# Patient Record
Sex: Female | Born: 1995 | Race: Black or African American | Hispanic: No | Marital: Single | Smoking: Never smoker
Health system: Southern US, Community
[De-identification: ages and names within clinical notes are randomized; demographics above are authoritative.]

## PROBLEM LIST (undated history)

## (undated) DIAGNOSIS — Z789 Other specified health status: Secondary | ICD-10-CM

## (undated) DIAGNOSIS — O24419 Gestational diabetes mellitus in pregnancy, unspecified control: Secondary | ICD-10-CM

## (undated) HISTORY — DX: Other specified health status: Z78.9

## (undated) HISTORY — PX: NO PAST SURGERIES: SHX2092

---

## 2015-11-08 NOTE — L&D Delivery Note (Signed)
20 y/o G1 now P1 who presents for SOL at 38 wks and 4 days. PT has had an uncomplicated pregnancy.  Delivery Note At 9:23 AM a viable female was delivered via Vaginal, Spontaneous Delivery (Presentation: vertex ; OA  ).  APGAR: 8, 9; weight 5 lb 15.8 oz (2716 g).   Placenta status: delivered with gentle traction.  Cord: 3 vessel with the following complications: none .   Anesthesia:  none Episiotomy: None Lacerations: Periurethral - no repain Est. Blood Loss (mL): 100  Mom to postpartum.  Baby to Couplet care / Skin to Skin.  [name] 08/18/2016, 10:44 AM

## 2016-03-17 ENCOUNTER — Encounter: Payer: Self-pay | Admitting: *Deleted

## 2016-04-12 ENCOUNTER — Ambulatory Visit (INDEPENDENT_AMBULATORY_CARE_PROVIDER_SITE_OTHER): Payer: Medicaid Other | Admitting: Student

## 2016-04-12 ENCOUNTER — Telehealth: Payer: Self-pay

## 2016-04-12 ENCOUNTER — Encounter: Payer: Self-pay | Admitting: Student

## 2016-04-12 ENCOUNTER — Other Ambulatory Visit (HOSPITAL_COMMUNITY)
Admission: RE | Admit: 2016-04-12 | Discharge: 2016-04-12 | Disposition: A | Discharge status: Home / Self Care | Payer: Medicaid Other | Source: Ambulatory Visit | Attending: Family Medicine | Admitting: Family Medicine

## 2016-04-12 VITALS — BP 124/70 | HR 84 | Wt 166.0 lb

## 2016-04-12 DIAGNOSIS — O0932 Supervision of pregnancy with insufficient antenatal care, second trimester: Secondary | ICD-10-CM

## 2016-04-12 DIAGNOSIS — Z1389 Encounter for screening for other disorder: Secondary | ICD-10-CM

## 2016-04-12 DIAGNOSIS — Z113 Encounter for screening for infections with a predominantly sexual mode of transmission: Secondary | ICD-10-CM

## 2016-04-12 LAB — POCT URINALYSIS DIP (DEVICE)
BILIRUBIN URINE: NEGATIVE
GLUCOSE, UA: NEGATIVE mg/dL
Hgb urine dipstick: NEGATIVE
KETONES UR: NEGATIVE mg/dL
Nitrite: NEGATIVE
Protein, ur: NEGATIVE mg/dL
Specific Gravity, Urine: 1.015 (ref 1.005–1.030)
Urobilinogen, UA: 0.2 mg/dL (ref 0.0–1.0)
pH: 7 (ref 5.0–8.0)

## 2016-04-12 MED ORDER — PRENATAL VITAMINS PLUS 27-1 MG PO TABS: 1.0000 | ORAL_TABLET | Freq: Every day | ORAL | Status: DC

## 2016-04-12 NOTE — Progress Notes (Signed)
  Subjective:    Melissa ErichsenDestiny Thornton is being seen today for her first obstetrical visit.  This is not a planned pregnancy. She is at 5854w2d gestation. Her obstetrical history is significant for late to care. Relationship with FOB: significant other, living together. Patient does intend to breast feed. Pregnancy history fully reviewed.  Patient reports no complaints.  Review of Systems:   Review of Systems Review of Systems - History obtained from the patient General ROS: negative Respiratory ROS: no cough, shortness of breath, or wheezing Cardiovascular ROS: no chest pain or dyspnea on exertion Gastrointestinal ROS: no abdominal pain, change in bowel habits, or black or bloody stools Genito-Urinary ROS: no dysuria, trouble voiding, or hematuria Musculoskeletal ROS: negative  Objective:  FHT 153 by doppler   BP 124/70 mmHg  Pulse 84  Wt 166 lb (75.297 kg)  LMP 11/22/2015 (Exact Date) Physical Exam  Exam Physical Examination: General appearance - alert, well appearing, and in no distress and oriented to person, place, and time Mental status - alert, oriented to person, place, and time, normal mood, behavior, speech, dress, motor activity, and thought processes Mouth - mucous membranes moist, pharynx normal without lesions and dental hygiene good Heart - normal rate, regular rhythm, normal S1, S2, no murmurs, rubs, clicks or gallops Abdomen - soft, nontender, nondistended, no masses or organomegaly. Fundal height 19 cm Pelvic - exam declined by the patient, examination not indicated Musculoskeletal - no joint tenderness, deformity or swelling Skin - normal coloration and turgor, no rashes, no suspicious skin lesions noted    Assessment:    Pregnancy: G1P0 Patient Active Problem List   Diagnosis Date Noted  . Late prenatal care affecting pregnancy in second trimester 04/12/2016       Plan:  1. Late prenatal care affecting pregnancy in second trimester  - Prenatal Profile -  [facility]Chlam[facility] - Culture, OB Urine - US MFM OB COMP + 14 WK; Future - [id number] PDM PROFILE  2. Encounter for routine screening for malformation using ultrasonics  - US MFM OB COMP + 14 WK; Future    Initial labs drawn. Prenatal vitamins. Problem list reviewed and updated. AFP3 discussed: declined. Role of ultrasound in pregnancy discussed; fetal survey: ordered. Amniocentesis discussed: not indicated. Follow up in 4 weeks.    Judeth Hornrin Archita Lomeli 04/12/2016

## 2016-04-12 NOTE — Telephone Encounter (Signed)
Pt's pharmacy called and stated that pt needed an Rx for her prenatal vitamins. Called pt's pharmacy and verified that they received the Rx.  Was informed that the pharmacy would contact the pt to pick up Rx.

## 2016-04-12 NOTE — Patient Instructions (Signed)
Second Trimester of Pregnancy The second trimester is from week 13 through week 28, months 4 through 6. The second trimester is often a time when you feel your best. Your body has also adjusted to being pregnant, and you begin to feel better physically. Usually, morning sickness has lessened or quit completely, you may have more energy, and you may have an increase in appetite. The second trimester is also a time when the fetus is growing rapidly. At the end of the sixth month, the fetus is about 9 inches long and weighs about 1 pounds. You will likely begin to feel the baby move (quickening) between 18 and 20 weeks of the pregnancy. BODY CHANGES Your body goes through many changes during pregnancy. The changes vary from woman to woman.   Your weight will continue to increase. You will notice your lower abdomen bulging out.  You may begin to get stretch marks on your hips, abdomen, and breasts.  You may develop headaches that can be relieved by medicines approved by your health care provider.  You may urinate more often because the fetus is pressing on your bladder.  You may develop or continue to have heartburn as a result of your pregnancy.  You may develop constipation because certain hormones are causing the muscles that push waste through your intestines to slow down.  You may develop hemorrhoids or swollen, bulging veins (varicose veins).  You may have back pain because of the weight gain and pregnancy hormones relaxing your joints between the bones in your pelvis and as a result of a shift in weight and the muscles that support your balance.  Your breasts will continue to grow and be tender.  Your gums may bleed and may be sensitive to brushing and flossing.  Dark spots or blotches (chloasma, mask of pregnancy) may develop on your face. This will likely fade after the baby is born.  A dark line from your belly button to the pubic area (linea nigra) may appear. This will likely fade  after the baby is born.  You may have changes in your hair. These can include thickening of your hair, rapid growth, and changes in texture. Some women also have hair loss during or after pregnancy, or hair that feels dry or thin. Your hair will most likely return to normal after your baby is born. WHAT TO EXPECT AT YOUR PRENATAL VISITS During a routine prenatal visit:  You will be weighed to make sure you and the fetus are growing normally.  Your blood pressure will be taken.  Your abdomen will be measured to track your baby's growth.  The fetal heartbeat will be listened to.  Any test results from the previous visit will be discussed. Your health care provider may ask you:  How you are feeling.  If you are feeling the baby move.  If you have had any abnormal symptoms, such as leaking fluid, bleeding, severe headaches, or abdominal cramping.  If you are using any tobacco products, including cigarettes, chewing tobacco, and electronic cigarettes.  If you have any questions. Other tests that may be performed during your second trimester include:  Blood tests that check for:  Low iron levels (anemia).  Gestational diabetes (between 24 and 28 weeks).  Rh antibodies.  Urine tests to check for infections, diabetes, or protein in the urine.  An ultrasound to confirm the proper growth and development of the baby.  An amniocentesis to check for possible genetic problems.  Fetal screens for spina bifida   and Down syndrome.  HIV (human immunodeficiency virus) testing. Routine prenatal testing includes screening for HIV, unless you choose not to have this test. HOME CARE INSTRUCTIONS   Avoid all smoking, herbs, alcohol, and unprescribed drugs. These chemicals affect the formation and growth of the baby.  Do not use any tobacco products, including cigarettes, chewing tobacco, and electronic cigarettes. If you need help quitting, ask your health care provider. You may receive  counseling support and other resources to help you quit.  Follow your health care provider's instructions regarding medicine use. There are medicines that are either safe or unsafe to take during pregnancy.  Exercise only as directed by your health care provider. Experiencing uterine cramps is a good sign to stop exercising.  Continue to eat regular, healthy meals.  Wear a good support bra for breast tenderness.  Do not use hot tubs, steam rooms, or saunas.  Wear your seat belt at all times when driving.  Avoid raw meat, uncooked cheese, cat litter boxes, and soil used by cats. These carry germs that can cause birth defects in the baby.  Take your prenatal vitamins.  Take 1500-2000 mg of calcium daily starting at the 20th week of pregnancy until you deliver your baby.  Try taking a stool softener (if your health care provider approves) if you develop constipation. Eat more high-fiber foods, such as fresh vegetables or fruit and whole grains. Drink plenty of fluids to keep your urine clear or pale yellow.  Take warm sitz baths to soothe any pain or discomfort caused by hemorrhoids. Use hemorrhoid cream if your health care provider approves.  If you develop varicose veins, wear support hose. Elevate your feet for 15 minutes, 3-4 times a day. Limit salt in your diet.  Avoid heavy lifting, wear low heel shoes, and practice good posture.  Rest with your legs elevated if you have leg cramps or low back pain.  Visit your dentist if you have not gone yet during your pregnancy. Use a soft toothbrush to brush your teeth and be gentle when you floss.  A sexual relationship may be continued unless your health care provider directs you otherwise.  Continue to go to all your prenatal visits as directed by your health care provider. SEEK MEDICAL CARE IF:   You have dizziness.  You have mild pelvic cramps, pelvic pressure, or nagging pain in the abdominal area.  You have persistent nausea,  vomiting, or diarrhea.  You have a bad smelling vaginal discharge.  You have pain with urination. SEEK IMMEDIATE MEDICAL CARE IF:   You have a fever.  You are leaking fluid from your vagina.  You have spotting or bleeding from your vagina.  You have severe abdominal cramping or pain.  You have rapid weight gain or loss.  You have shortness of breath with chest pain.  You notice sudden or extreme swelling of your face, hands, ankles, feet, or legs.  You have not felt your baby move in over an hour.  You have severe headaches that do not go away with medicine.  You have vision changes.   This information is not intended to replace advice given to you by your health care provider. Make sure you discuss any questions you have with your health care provider.   Document Released: 10/18/2001 Document Revised: 11/14/2014 Document Reviewed: 12/25/2012 Elsevier Interactive Patient Education 2016 Elsevier Inc[employer]   Safe Medications in Pregnancy   Acne: Benzoyl Peroxide Salicylic Acid  Backache/Headache: Tylenol: 2 regular strength every 4 hours   OR              2 Extra strength every 6 hours  Colds/Coughs/Allergies: Benadryl (alcohol free) 25 mg every 6 hours as needed Breath right strips Claritin Cepacol throat lozenges Chloraseptic throat spray Cold-Eeze- up to three times per day Cough drops, alcohol free Flonase (by prescription only) Guaifenesin Mucinex Robitussin DM (plain only, alcohol free) Saline nasal spray/drops Sudafed (pseudoephedrine) & Actifed ** use only after [redacted] weeks gestation and if you do not have high blood pressure Tylenol Vicks Vaporub Zinc lozenges Zyrtec   Constipation: Colace Ducolax suppositories Fleet enema Glycerin suppositories Metamucil Milk of magnesia Miralax Senokot Smooth move tea  Diarrhea: Kaopectate Imodium A-D  *NO pepto Bismol  Hemorrhoids: Anusol Anusol HC Preparation  H Tucks  Indigestion: Tums Maalox Mylanta Zantac  Pepcid  Insomnia: Benadryl (alcohol free) 25mg every 6 hours as needed Tylenol PM Unisom, no Gelcaps  Leg Cramps: Tums MagGel  Nausea/Vomiting:  Bonine Dramamine Emetrol Ginger extract Sea bands Meclizine  Nausea medication to take during pregnancy:  Unisom (doxylamine succinate 25 mg tablets) Take one tablet daily at bedtime. If symptoms are not adequately controlled, the dose can be increased to a maximum recommended dose of two tablets daily (1/2 tablet in the morning, 1/2 tablet mid-afternoon and one at bedtime). Vitamin B6 100mg tablets. Take one tablet twice a day (up to 200 mg per day).  Skin Rashes: Aveeno products Benadryl cream or 25mg every 6 hours as needed Calamine Lotion 1% cortisone cream  Yeast infection: Gyne-lotrimin 7 Monistat 7  Gum/tooth pain: Anbesol  **If taking multiple medications, please check labels to avoid duplicating the same active ingredients **take medication as directed on the label ** Do not exceed 4000 mg of tylenol in 24 hours **Do not take medications that contain aspirin or ibuprofen    

## 2016-04-12 NOTE — Progress Notes (Signed)
Anatomy Scan Scheduled for 06/12 at 2:30 pm.

## 2016-04-13 LAB — PRENATAL PROFILE (SOLSTAS)
ANTIBODY SCREEN: NEGATIVE
BASOS ABS: 0 {cells}/uL (ref 0–200)
BASOS PCT: 0 %
EOS ABS: 0 {cells}/uL — AB (ref 15–500)
Eosinophils Relative: 0 %
HCT: 35 % (ref 35.0–45.0)
HIV 1&2 Ab, 4th Generation: NONREACTIVE
Hemoglobin: 11.5 g/dL — ABNORMAL LOW (ref 11.7–15.5)
Hepatitis B Surface Ag: NEGATIVE
LYMPHS PCT: 16 %
Lymphs Abs: 1424 cells/uL (ref 850–3900)
MCH: 27.3 pg (ref 27.0–33.0)
MCHC: 32.9 g/dL (ref 32.0–36.0)
MCV: 83.1 fL (ref 80.0–100.0)
MONOS PCT: 7 %
MPV: 10 fL (ref 7.5–12.5)
Monocytes Absolute: 623 cells/uL (ref 200–950)
Neutro Abs: 6853 cells/uL (ref 1500–7800)
Neutrophils Relative %: 77 %
PLATELETS: 292 10*3/uL (ref 140–400)
RBC: 4.21 MIL/uL (ref 3.80–5.10)
RDW: 14.8 % (ref 11.0–15.0)
RH TYPE: POSITIVE
RUBELLA: 5.47 {index} — AB (ref <0.90)
WBC: 8.9 10*3/uL (ref 3.8–10.8)

## 2016-04-13 LAB — GC/CHLAMYDIA PROBE AMP (~~LOC~~) NOT AT ARMC
CHLAMYDIA, DNA PROBE: NEGATIVE
NEISSERIA GONORRHEA: NEGATIVE

## 2016-04-14 LAB — CULTURE, OB URINE: Colony Count: 80000

## 2016-04-18 ENCOUNTER — Ambulatory Visit (HOSPITAL_COMMUNITY)
Admission: RE | Admit: 2016-04-18 | Discharge: 2016-04-18 | Disposition: A | Discharge status: Home / Self Care | Payer: Medicaid Other | Source: Ambulatory Visit | Attending: Student | Admitting: Student

## 2016-04-18 DIAGNOSIS — O0932 Supervision of pregnancy with insufficient antenatal care, second trimester: Secondary | ICD-10-CM | POA: Diagnosis not present

## 2016-04-18 DIAGNOSIS — Z36 Encounter for antenatal screening of mother: Secondary | ICD-10-CM | POA: Insufficient documentation

## 2016-04-18 DIAGNOSIS — Z3A21 21 weeks gestation of pregnancy: Secondary | ICD-10-CM | POA: Diagnosis not present

## 2016-04-18 DIAGNOSIS — Z1389 Encounter for screening for other disorder: Secondary | ICD-10-CM

## 2016-04-19 ENCOUNTER — Telehealth: Payer: Self-pay

## 2016-04-19 DIAGNOSIS — Z0489 Encounter for examination and observation for other specified reasons: Secondary | ICD-10-CM

## 2016-04-19 DIAGNOSIS — IMO0002 Reserved for concepts with insufficient information to code with codable children: Secondary | ICD-10-CM

## 2016-04-19 LAB — CP5000051 PDM PROFILE
Amphetamines: NEGATIVE ng/mL (ref <500)
BARBITURATES: NEGATIVE ng/mL (ref <300)
BENZODIAZEPINES: NEGATIVE ng/mL (ref <100)
Buprenorphine: NEGATIVE ng/mL (ref <5)
Cocaine Metabolite: NEGATIVE ng/mL (ref <150)
DESMETHYLTRAMADOL: NEGATIVE ng/mL (ref <100)
FENTANYL: NEGATIVE ng/mL (ref <0.5)
MDMA: NEGATIVE ng/mL (ref <500)
MEPROBAMATE: NEGATIVE ng/mL (ref <1000)
METHADONE METABOLITE: NEGATIVE ng/mL (ref <100)
Marijuana Metabolite: NEGATIVE ng/mL (ref <20)
Meperidine: NEGATIVE ng/mL (ref <100)
NORFENTANYL: NEGATIVE ng/mL (ref <0.5)
Normeperidine: NEGATIVE ng/mL (ref <100)
Nortapentadol: NEGATIVE ng/mL (ref <50)
OPIATES: NEGATIVE ng/mL (ref <100)
OXYCODONE: NEGATIVE ng/mL (ref <100)
PROPOXYPHENE: NEGATIVE ng/mL (ref <300)
Phencyclidine: NEGATIVE ng/mL (ref <25)
TAPENTADOL: NEGATIVE ng/mL (ref <50)
Tramadol: NEGATIVE ng/mL (ref <100)
ZOLPIDEM METABOLITE: NEGATIVE ng/mL (ref <5)
ZOLPIDEM: NEGATIVE ng/mL (ref <5)

## 2016-04-19 NOTE — Telephone Encounter (Addendum)
Called pt and was told by a female to call back at 3pm today.   1526  Called pt and heard message stating that person called does not have voice mail set up yet.  *Pt has order placed for follow up US and can be scheduled between 7/10-7/24.   Pt also needs LOB appt during first or second week of July.   6/14  1140  Called pt and informed of next prenatal visit on 7/5 @ 0740.  US appt scheduled on 7/10 @ 1015. These appts can be viewed on her My Chart as well. Pt voiced understanding of all information given.

## 2016-04-19 NOTE — Telephone Encounter (Signed)
This patient is calling to check on her ultrasound appt that is suppose to be set up by us. Can someone check on this please and call her with her appt. Thanks :)

## 2016-04-19 NOTE — Addendum Note (Signed)
Addended by: Jill SideAY, DIANE L on: 04/19/2016 03:31 PM   Modules accepted: Orders

## 2016-04-20 ENCOUNTER — Telehealth: Payer: Self-pay

## 2016-04-20 NOTE — Telephone Encounter (Signed)
Patient called ultrasound asked her to call us she needs to have another anatomy ultrasound scheduled, they were not able to see anatomy and due to that she needs another one. Can someone please make her this appt and call her with the time and date, thanks :)

## 2016-05-11 ENCOUNTER — Ambulatory Visit (INDEPENDENT_AMBULATORY_CARE_PROVIDER_SITE_OTHER): Payer: Medicaid Other | Admitting: Family

## 2016-05-11 VITALS — BP 109/54 | HR 67 | Ht 59.0 in | Wt 163.4 lb

## 2016-05-11 DIAGNOSIS — O0932 Supervision of pregnancy with insufficient antenatal care, second trimester: Secondary | ICD-10-CM | POA: Diagnosis not present

## 2016-05-11 LAB — POCT URINALYSIS DIP (DEVICE)
Bilirubin Urine: NEGATIVE
Glucose, UA: NEGATIVE mg/dL
HGB URINE DIPSTICK: NEGATIVE
Ketones, ur: NEGATIVE mg/dL
Nitrite: NEGATIVE
Protein, ur: 30 mg/dL — AB
SPECIFIC GRAVITY, URINE: 1.02 (ref 1.005–1.030)
UROBILINOGEN UA: 2 mg/dL — AB (ref 0.0–1.0)
pH: 6.5 (ref 5.0–8.0)

## 2016-05-11 NOTE — Patient Instructions (Addendum)
Magnesium or Benadryl  AREA PEDIATRIC/FAMILY PRACTICE PHYSICIANS  [facility] [address] [place], [place] [address] Phone - [phone]   Fax - [phone]  [facility] [address] [place], [place]  [address] Phone - [phone]   Fax - [phone]  [facility] [address] [place], [place]  [address] Phone - [phone]   Fax - [phone]  [facility] [address] [place], [place]  [address] Phone - [phone]   Fax - [phone]  [facility] [address] [place], [place]  [address] Phone - [phone]   Fax - [phone]  [facility] [address], [address] [place], [place]  [address] Phone - [phone]   Fax - [phone]  [facility] [address] [place], [place]  [address] Phone - [phone]   Fax - [phone]  [facility] [address] [place], [place]  [address] Phone - [phone]   Fax - [phone]  [facility] [address] [place], [place]  [address] Phone - [phone]   Fax - [phone] [facility] [address] [place], [place]  [address] Phone - [phone]   Fax - [phone]  [facility] [address] [place], [place]  [address] Phone - [phone]   Fax - [phone]  [facility] [address], [address] [place], [place]  [address] Phone - [phone]   Fax - [phone]  [facility] [address], [address] [place], [place]  [address] Phone - [phone]   Fax - [phone]  [facility] [address], [address] [place] [place], [place]  [address] Phone - [phone]  [facility] [address] [place], [place]  [address] Phone - [phone]   Fax - [phone]  [facility] [address], [address] [place], [place]  [address] Phone - [phone]   Fax - [phone]  [facility] [address] [place], [place]  [address] Phone - [phone]   Fax - [phone]  [facility] [address] [place], [place]  [address] Phone - [phone]   Fax - [phone]   [facility] [address] [place], [place]  [address] Phone - [phone]   Fax - [phone]  [facility] [address], [address] [place], [place]  [address] Phone - [phone]   Fax - [phone]  DAVID RUBIN [address] [place], [place]  [address] Phone - [phone]   Fax - [phone]  [facility] [address] [place], [place]  [address] Phone - [phone]   Fax - [phone]  [facility] [address] [place], [place]  [address] Phone - [phone]   Fax - [phone] [facility] [address] [place], [place]  [address] Phone - [phone]   Fax - [phone]  [facility] [address] [place], [place]  [address] Phone - [phone]   Fax - [phone]  [facility] [place] [address] [place], [place]  [address] Phone - [phone]   Fax - [phone]

## 2016-05-11 NOTE — Progress Notes (Signed)
Subjective:  Melissa Thornton is a 20 y.o. G1P0 at 5076w3d being seen today for ongoing prenatal care.  Patient reports difficulty sleeping.  Contractions: Not present.  Vag. Bleeding: None. Movement: Present. Denies leaking of fluid.   The following portions of the patient's history were reviewed and updated as appropriate: allergies, current medications, past family history, past medical history, past social history, past surgical history and problem list.   Objective:   Filed Vitals:   05/11/16 0803 05/11/16 0844  BP: 109/54   Pulse: 67   Height:  4\' 11"  (1.499 m)  Weight: 163 lb 6.4 oz (74.118 kg)     Fetal Status: Fetal Heart Rate (bpm): 142 Fundal Height: 29 cm Movement: Present     General:  Alert, oriented and cooperative. Patient is in no acute distress.  Skin: Skin is warm and dry. No rash noted.   Cardiovascular: Normal heart rate noted  Respiratory: Normal respiratory effort, no problems with respiration noted  Abdomen: Soft, gravid, appropriate for gestational age. Pain/Pressure: Present     Vaginal: Vag. Bleeding: None.       Cervix: Exam revealed        Extremities: Normal range of motion.  Edema: None  Mental Status: Normal mood and affect. Normal behavior. Normal judgment and thought content.   Urinalysis:     Protein 1+ Glucose negative  Assessment and Plan:  Pregnancy: G1P0 at 6576w3d  1. Late prenatal care affecting pregnancy in second trimester - Reviewed lab results - Reviewed ultrasound results (poor visualization of gender and lower extremities) > follow-up ultrasound scheduled for 05/16/16  2.  Difficulty Sleeping -  Recommended trying Magnesium or Benadryl  Preterm labor symptoms and general obstetric precautions including but not limited to vaginal bleeding, contractions, leaking of fluid and fetal movement were reviewed in detail with the patient. Please refer to After Visit Summary for other counseling recommendations.  Return in about 4 weeks (around  06/08/2016).   Eino FarberWalidah Kennith GainN Karim, CNM

## 2016-05-11 NOTE — Progress Notes (Signed)
Pt states she is not sleeping well.  ° °

## 2016-05-16 ENCOUNTER — Ambulatory Visit (HOSPITAL_COMMUNITY)
Admission: RE | Admit: 2016-05-16 | Discharge: 2016-05-16 | Disposition: A | Discharge status: Home / Self Care | Payer: Medicaid Other | Source: Ambulatory Visit | Attending: Student | Admitting: Student

## 2016-05-16 DIAGNOSIS — Z0489 Encounter for examination and observation for other specified reasons: Secondary | ICD-10-CM

## 2016-05-16 DIAGNOSIS — O0932 Supervision of pregnancy with insufficient antenatal care, second trimester: Secondary | ICD-10-CM | POA: Diagnosis not present

## 2016-05-16 DIAGNOSIS — Z3A25 25 weeks gestation of pregnancy: Secondary | ICD-10-CM | POA: Diagnosis not present

## 2016-05-16 DIAGNOSIS — IMO0002 Reserved for concepts with insufficient information to code with codable children: Secondary | ICD-10-CM

## 2016-05-16 DIAGNOSIS — Z36 Encounter for antenatal screening of mother: Secondary | ICD-10-CM | POA: Diagnosis not present

## 2016-05-17 ENCOUNTER — Other Ambulatory Visit: Payer: Self-pay | Admitting: *Deleted

## 2016-05-17 DIAGNOSIS — Z1389 Encounter for screening for other disorder: Secondary | ICD-10-CM

## 2016-05-17 DIAGNOSIS — O0932 Supervision of pregnancy with insufficient antenatal care, second trimester: Secondary | ICD-10-CM

## 2016-06-07 ENCOUNTER — Ambulatory Visit (HOSPITAL_COMMUNITY)
Admission: RE | Admit: 2016-06-07 | Discharge: 2016-06-07 | Disposition: A | Discharge status: Home / Self Care | Payer: Medicaid Other | Source: Ambulatory Visit | Attending: Student | Admitting: Student

## 2016-06-07 ENCOUNTER — Other Ambulatory Visit: Payer: Self-pay | Admitting: Student

## 2016-06-07 DIAGNOSIS — O0932 Supervision of pregnancy with insufficient antenatal care, second trimester: Secondary | ICD-10-CM

## 2016-06-07 DIAGNOSIS — Z3A28 28 weeks gestation of pregnancy: Secondary | ICD-10-CM | POA: Insufficient documentation

## 2016-06-07 DIAGNOSIS — Z1389 Encounter for screening for other disorder: Secondary | ICD-10-CM

## 2016-06-07 DIAGNOSIS — O26842 Uterine size-date discrepancy, second trimester: Secondary | ICD-10-CM | POA: Insufficient documentation

## 2016-06-08 ENCOUNTER — Ambulatory Visit (INDEPENDENT_AMBULATORY_CARE_PROVIDER_SITE_OTHER): Payer: Medicaid Other | Admitting: Advanced Practice Midwife

## 2016-06-08 ENCOUNTER — Encounter: Payer: Self-pay | Admitting: Advanced Practice Midwife

## 2016-06-08 VITALS — BP 103/65 | HR 83 | Wt 165.0 lb

## 2016-06-08 DIAGNOSIS — Z3493 Encounter for supervision of normal pregnancy, unspecified, third trimester: Secondary | ICD-10-CM | POA: Diagnosis not present

## 2016-06-08 DIAGNOSIS — Z23 Encounter for immunization: Secondary | ICD-10-CM | POA: Diagnosis not present

## 2016-06-08 DIAGNOSIS — O0932 Supervision of pregnancy with insufficient antenatal care, second trimester: Secondary | ICD-10-CM | POA: Diagnosis not present

## 2016-06-08 DIAGNOSIS — O0933 Supervision of pregnancy with insufficient antenatal care, third trimester: Secondary | ICD-10-CM | POA: Diagnosis not present

## 2016-06-08 LAB — CBC
HCT: 34.1 % — ABNORMAL LOW (ref 35.0–45.0)
Hemoglobin: 11.3 g/dL — ABNORMAL LOW (ref 11.7–15.5)
MCH: 28 pg (ref 27.0–33.0)
MCHC: 33.1 g/dL (ref 32.0–36.0)
MCV: 84.4 fL (ref 80.0–100.0)
MPV: 10.4 fL (ref 7.5–12.5)
PLATELETS: 274 10*3/uL (ref 140–400)
RBC: 4.04 MIL/uL (ref 3.80–5.10)
RDW: 14 % (ref 11.0–15.0)
WBC: 8 10*3/uL (ref 3.8–10.8)

## 2016-06-08 LAB — POCT URINALYSIS DIP (DEVICE)
GLUCOSE, UA: NEGATIVE mg/dL
Hgb urine dipstick: NEGATIVE
Ketones, ur: NEGATIVE mg/dL
NITRITE: NEGATIVE
PH: 6 (ref 5.0–8.0)
PROTEIN: 30 mg/dL — AB
Specific Gravity, Urine: >=1.03 (ref 1.005–1.030)
UROBILINOGEN UA: 1 mg/dL (ref 0.0–1.0)

## 2016-06-08 NOTE — Progress Notes (Signed)
Subjective:    Melissa Thornton is a 20 y.o. G1P0 [redacted]w[redacted]d being seen today for her obstetrical visit.  Patient reports no complaints. Fetal movement: normal.  Objective:    BP 103/65   Pulse 83   Wt 165 lb (74.8 kg)   LMP 11/22/2015 (Exact Date)   BMI 33.33 kg/m   Physical Exam  Abdomen gravid Size = dates Resp unlabored  Exam  FHT: Fetal Heart Rate (bpm): 148  Uterine Size:    Presentation:       Assessment:    Pregnancy:  G1P0    Plan:    Patient Active Problem List   Diagnosis Date Noted  . Late prenatal care affecting pregnancy in second trimester 04/12/2016       Glucola today  Infant feeding: plans to breastfeed. Follow up in 2 Weeks.

## 2016-06-08 NOTE — Patient Instructions (Addendum)

## 2016-06-08 NOTE — Progress Notes (Signed)
Breastfeeding discussed with patient  

## 2016-06-09 LAB — RPR

## 2016-06-09 LAB — GLUCOSE TOLERANCE, 1 HOUR (50G) W/O FASTING: GLUCOSE, 1 HR, GESTATIONAL: 111 mg/dL (ref <140)

## 2016-06-09 LAB — HIV ANTIBODY (ROUTINE TESTING W REFLEX): HIV: NONREACTIVE

## 2016-06-22 ENCOUNTER — Ambulatory Visit (INDEPENDENT_AMBULATORY_CARE_PROVIDER_SITE_OTHER): Payer: Medicaid Other | Admitting: Medical

## 2016-06-22 VITALS — BP 111/72 | HR 106 | Wt 170.1 lb

## 2016-06-22 DIAGNOSIS — R12 Heartburn: Secondary | ICD-10-CM | POA: Diagnosis not present

## 2016-06-22 DIAGNOSIS — O26893 Other specified pregnancy related conditions, third trimester: Secondary | ICD-10-CM | POA: Diagnosis not present

## 2016-06-22 DIAGNOSIS — O0932 Supervision of pregnancy with insufficient antenatal care, second trimester: Secondary | ICD-10-CM

## 2016-06-22 LAB — POCT URINALYSIS DIP (DEVICE)
GLUCOSE, UA: 100 mg/dL — AB
HGB URINE DIPSTICK: NEGATIVE
KETONES UR: NEGATIVE mg/dL
Nitrite: NEGATIVE
Protein, ur: NEGATIVE mg/dL
Urobilinogen, UA: 1 mg/dL (ref 0.0–1.0)
pH: 6 (ref 5.0–8.0)

## 2016-06-22 MED ORDER — PANTOPRAZOLE SODIUM 20 MG PO TBEC: 20.0000 mg | DELAYED_RELEASE_TABLET | Freq: Every day | ORAL | 2 refills | Status: DC

## 2016-06-22 NOTE — Progress Notes (Signed)
Subjective:  Melissa ErichsenDestiny Thornton is a 20 y.o. G1P0 at 5815w3d being seen today for ongoing prenatal care.  She is currently monitored for the following issues for this low-risk pregnancy and has Late prenatal care affecting pregnancy in second trimester on her problem list.  Patient reports heartburn. Had tried TUMS without relief.  Contractions: Not present. Vag. Bleeding: None.  Movement: Present. Denies leaking of fluid.   The following portions of the patient's history were reviewed and updated as appropriate: allergies, current medications, past family history, past medical history, past social history, past surgical history and problem list. Problem list updated.  Objective:   Vitals:   06/22/16 1101  BP: 111/72  Pulse: (!) 106  Weight: 170 lb 1.6 oz (77.2 kg)    Fetal Status: Fetal Heart Rate (bpm): 150 Fundal Height: 31 cm Movement: Present     General:  Alert, oriented and cooperative. Patient is in no acute distress.  Skin: Skin is warm and dry. No rash noted.   Cardiovascular: Normal heart rate noted  Respiratory: Normal respiratory effort, no problems with respiration noted  Abdomen: Soft, gravid, appropriate for gestational age. Pain/Pressure: Absent     Pelvic:  Cervical exam deferred        Extremities: Normal range of motion.  Edema: None  Mental Status: Normal mood and affect. Normal behavior. Normal judgment and thought content.   Urinalysis: Urine Protein: Negative Urine Glucose: 1+  Assessment and Plan:  Pregnancy: G1P0 at 7415w3d  1. Late prenatal care affecting pregnancy in second trimester  2. Heartburn during pregnancy in third trimester, antepartum - pantoprazole (PROTONIX) 20 MG tablet; Take 1 tablet (20 mg total) by mouth daily.  Dispense: 30 tablet; Refill: 2 - Discussed avoiding late night meals, spicy/greasy foods and elevation of the head of the bed for symptom control - Patient states symptoms are worse at night, advised to started with daily dose q hs,  will discuss at next visit if BID dosing is needed   Preterm labor symptoms and general obstetric precautions including but not limited to vaginal bleeding, contractions, leaking of fluid and fetal movement were reviewed in detail with the patient. Please refer to After Visit Summary for other counseling recommendations.  Return in about 2 weeks (around 07/06/2016) for LROB.   Marny LowensteinJulie N Wenzel, PA-C

## 2016-06-22 NOTE — Patient Instructions (Signed)
Braxton Hicks Contractions °Contractions of the uterus can occur throughout pregnancy. Contractions are not always a sign that you are in labor.  °WHAT ARE BRAXTON HICKS CONTRACTIONS?  °Contractions that occur before labor are called Braxton Hicks contractions, or false labor. Toward the end of pregnancy (32-34 weeks), these contractions can develop more often and may become more forceful. This is not true labor because these contractions do not result in opening (dilatation) and thinning of the cervix. They are sometimes difficult to tell apart from true labor because these contractions can be forceful and people have different pain tolerances. You should not feel embarrassed if you go to the hospital with false labor. Sometimes, the only way to tell if you are in true labor is for your health care provider to look for changes in the cervix. °If there are no prenatal problems or other health problems associated with the pregnancy, it is completely safe to be sent home with false labor and await the onset of true labor. °HOW CAN YOU TELL THE DIFFERENCE BETWEEN TRUE AND FALSE LABOR? °False Labor °· The contractions of false labor are usually shorter and not as hard as those of true labor.   °· The contractions are usually irregular.   °· The contractions are often felt in the front of the lower abdomen and in the groin.   °· The contractions may go away when you walk around or change positions while lying down.   °· The contractions get weaker and are shorter lasting as time goes on.   °· The contractions do not usually become progressively stronger, regular, and closer together as with true labor.   °True Labor °· Contractions in true labor last 30-70 seconds, become very regular, usually become more intense, and increase in frequency.   °· The contractions do not go away with walking.   °· The discomfort is usually felt in the top of the uterus and spreads to the lower abdomen and low back.   °· True labor can be  determined by your health care provider with an exam. This will show that the cervix is dilating and getting thinner.   °WHAT TO REMEMBER °· Keep up with your usual exercises and follow other instructions given by your health care provider.   °· Take medicines as directed by your health care provider.   °· Keep your regular prenatal appointments.   °· Eat and drink lightly if you think you are going into labor.   °· If Braxton Hicks contractions are making you uncomfortable:   °¨ Change your position from lying down or resting to walking, or from walking to resting.   °¨ Sit and rest in a tub of warm water.   °¨ Drink 2-3 glasses of water. Dehydration may cause these contractions.   °¨ Do slow and deep breathing several times an hour.   °WHEN SHOULD I SEEK IMMEDIATE MEDICAL CARE? °Seek immediate medical care if: °· Your contractions become stronger, more regular, and closer together.   °· You have fluid leaking or gushing from your vagina.   °· You have a fever.   °· You pass blood-tinged mucus.   °· You have vaginal bleeding.   °· You have continuous abdominal pain.   °· You have low back pain that you never had before.   °· You feel your baby's head pushing down and causing pelvic pressure.   °· Your baby is not moving as much as it used to.   °  °This information is not intended to replace advice given to you by your health care provider. Make sure you discuss any questions you have with your health care   provider. °  °Document Released: 10/24/2005 Document Revised: 10/29/2013 Document Reviewed: 08/05/2013 °Elsevier Interactive Patient Education ©2016 Elsevier Inc. ° °

## 2016-06-22 NOTE — Progress Notes (Signed)
C/o severe heartburn- tums not helping.

## 2016-07-07 ENCOUNTER — Ambulatory Visit (HOSPITAL_COMMUNITY)
Admission: RE | Admit: 2016-07-07 | Discharge: 2016-07-07 | Disposition: A | Discharge status: Home / Self Care | Payer: Medicaid Other | Source: Ambulatory Visit | Attending: Advanced Practice Midwife | Admitting: Advanced Practice Midwife

## 2016-07-07 DIAGNOSIS — Z3A32 32 weeks gestation of pregnancy: Secondary | ICD-10-CM | POA: Insufficient documentation

## 2016-07-07 DIAGNOSIS — O0933 Supervision of pregnancy with insufficient antenatal care, third trimester: Secondary | ICD-10-CM

## 2016-07-07 DIAGNOSIS — O26843 Uterine size-date discrepancy, third trimester: Secondary | ICD-10-CM | POA: Insufficient documentation

## 2016-07-08 ENCOUNTER — Encounter: Payer: Self-pay | Admitting: Advanced Practice Midwife

## 2016-07-08 DIAGNOSIS — O26849 Uterine size-date discrepancy, unspecified trimester: Secondary | ICD-10-CM | POA: Insufficient documentation

## 2016-07-13 ENCOUNTER — Encounter: Payer: Medicaid Other | Admitting: Certified Nurse Midwife

## 2016-07-26 ENCOUNTER — Ambulatory Visit (INDEPENDENT_AMBULATORY_CARE_PROVIDER_SITE_OTHER): Payer: Medicaid Other | Admitting: Family

## 2016-07-26 ENCOUNTER — Other Ambulatory Visit (HOSPITAL_COMMUNITY)
Admission: RE | Admit: 2016-07-26 | Discharge: 2016-07-26 | Disposition: A | Discharge status: Home / Self Care | Payer: Medicaid Other | Source: Ambulatory Visit | Attending: Family | Admitting: Family

## 2016-07-26 VITALS — BP 115/74 | HR 91 | Wt 179.5 lb

## 2016-07-26 DIAGNOSIS — Z113 Encounter for screening for infections with a predominantly sexual mode of transmission: Secondary | ICD-10-CM

## 2016-07-26 DIAGNOSIS — O0933 Supervision of pregnancy with insufficient antenatal care, third trimester: Secondary | ICD-10-CM

## 2016-07-26 DIAGNOSIS — Z3493 Encounter for supervision of normal pregnancy, unspecified, third trimester: Secondary | ICD-10-CM

## 2016-07-26 DIAGNOSIS — O0932 Supervision of pregnancy with insufficient antenatal care, second trimester: Secondary | ICD-10-CM

## 2016-07-26 DIAGNOSIS — Z3483 Encounter for supervision of other normal pregnancy, third trimester: Secondary | ICD-10-CM

## 2016-07-26 LAB — POCT URINALYSIS DIP (DEVICE)
Bilirubin Urine: NEGATIVE
GLUCOSE, UA: NEGATIVE mg/dL
Hgb urine dipstick: NEGATIVE
KETONES UR: NEGATIVE mg/dL
Nitrite: NEGATIVE
PROTEIN: 30 mg/dL — AB
Specific Gravity, Urine: 1.02 (ref 1.005–1.030)
Urobilinogen, UA: 1 mg/dL (ref 0.0–1.0)
pH: 6.5 (ref 5.0–8.0)

## 2016-07-26 NOTE — Progress Notes (Signed)
Wants flu vaccine next visit

## 2016-07-26 NOTE — Progress Notes (Signed)
   PRENATAL VISIT NOTE  Subjective:  Melissa Thornton is a 20 y.o. G1P0 at 7363w2d being seen today for ongoing prenatal care.  She is currently monitored for the following issues for this low-risk pregnancy and has Late prenatal care affecting pregnancy in second trimester and Uterine size date discrepancy on her problem list.  Patient reports no complaints.  Contractions: Not present. Vag. Bleeding: None.  Movement: Present. Denies leaking of fluid.   The following portions of the patient's history were reviewed and updated as appropriate: allergies, current medications, past family history, past medical history, past social history, past surgical history and problem list. Problem list updated.  Objective:   Vitals:   07/26/16 1135  BP: 115/74  Pulse: 91  Weight: 179 lb 8 oz (81.4 kg)    Fetal Status: Fetal Heart Rate (bpm): 152 Fundal Height: 36 cm Movement: Present     General:  Alert, oriented and cooperative. Patient is in no acute distress.  Skin: Skin is warm and dry. No rash noted.   Cardiovascular: Normal heart rate noted  Respiratory: Normal respiratory effort, no problems with respiration noted  Abdomen: Soft, gravid, appropriate for gestational age. Pain/Pressure: Present     Pelvic:  Cervical exam deferred        Extremities: Normal range of motion.  Edema: Trace  Mental Status: Normal mood and affect. Normal behavior. Normal judgment and thought content.   Urinalysis: Urine Protein: 1+ Urine Glucose: Negative  Assessment and Plan:  Pregnancy: G1P0 at 6063w2d  1. Supervision of normal pregnancy in third trimester - Culture, beta strep (group b only) - GC[facility]Chlamydia [facility]  Preterm labor symptoms and general obstetric precautions including but not limited to vaginal bleeding, contractions, leaking of fluid and fetal movement were reviewed in detail with the patient. Please refer to After Visit Summary for other counseling recommendations.    Return in 1 week (on 08/02/2016).  Eino FarberWalidah Kennith GainN Karim, CNM

## 2016-07-26 NOTE — Patient Instructions (Addendum)
  Group B streptococcus (GBS) is a type of bacteria often found in healthy women. GBS is not the same as the bacteria that causes strep throat. You may have GBS in your vagina, rectum, or bladder. GBS does not spread through sexual contact, but it can be passed to a baby during childbirth. This can be dangerous for your baby. It is not dangerous to you and usually does not cause any symptoms. Your health care provider may test you for GBS when your pregnancy is between 35 and 37 weeks. GBS is dangerous only during birth, so there is no need to test for it earlier. It is possible to have GBS during pregnancy and never pass it to your baby. If your test results are positive for GBS, your health care provider may recommend giving you antibiotic medicine during delivery to make sure your baby stays healthy. RISK FACTORS You are more likely to pass GBS to your baby if:   Your water breaks (ruptured membrane) or you go into labor before 37 weeks.  Your water breaks 18 hours before you deliver.  You passed GBS during a previous pregnancy.  You have a urinary tract infection caused by GBS any time during pregnancy.  You have a fever during labor. SYMPTOMS Most women who have GBS do not have any symptoms. If you have a urinary tract infection caused by GBS, you might have frequent or painful urination and fever. Babies who get GBS usually show symptoms within 7 days of birth. Symptoms may include:   Breathing problems.  Heart and blood pressure problems.  Digestive and kidney problems. DIAGNOSIS Routine screening for GBS is recommended for all pregnant women. A health care provider takes a sample of the fluid in your vagina and rectum with a swab. It is then sent to a lab to be checked for GBS. A sample of your urine may also be checked for the bacteria.  TREATMENT If you test positive for GBS, you may need treatment with an antibiotic medicine during labor. As soon as you go into labor, or as soon  as your membranes rupture, you will get the antibiotic medicine through an IV access. You will continue to get the medicine until after you give birth. You do not need antibiotic medicine if you are having a cesarean delivery.If your baby shows signs or symptoms of GBS after birth, your baby can also be treated with an antibiotic medicine. HOME CARE INSTRUCTIONS   Take all antibiotic medicine as prescribed by your health care provider. Only take medicine as directed.   Continue with prenatal visits and care.   Keep all follow-up appointments.  SEEK MEDICAL CARE IF:   You have pain when you urinate.   You have to urinate frequently.   You have a fever.  SEEK IMMEDIATE MEDICAL CARE IF:   Your membranes rupture.  You go into labor.   This information is not intended to replace advice given to you by your health care provider. Make sure you discuss any questions you have with your health care provider.   Document Released: 01/31/2008 Document Revised: 10/29/2013 Document Reviewed: 08/16/2013 Elsevier Interactive Patient Education 2016 Elsevier Inc.  THINGS TO [employer] NURSING BRA NURSING PADS WIPES OUTFIT TO GO HOME - MOM, BABY CAR SEAT

## 2016-07-27 LAB — CULTURE, BETA STREP (GROUP B ONLY)

## 2016-07-27 LAB — GC/CHLAMYDIA PROBE AMP (~~LOC~~) NOT AT ARMC
CHLAMYDIA, DNA PROBE: NEGATIVE
Neisseria Gonorrhea: NEGATIVE

## 2016-08-02 ENCOUNTER — Ambulatory Visit (INDEPENDENT_AMBULATORY_CARE_PROVIDER_SITE_OTHER): Payer: Medicaid Other | Admitting: Obstetrics and Gynecology

## 2016-08-02 DIAGNOSIS — O0933 Supervision of pregnancy with insufficient antenatal care, third trimester: Secondary | ICD-10-CM

## 2016-08-02 DIAGNOSIS — O99820 Streptococcus B carrier state complicating pregnancy: Secondary | ICD-10-CM

## 2016-08-02 DIAGNOSIS — Z22330 Carrier of Group B streptococcus: Secondary | ICD-10-CM

## 2016-08-02 NOTE — Progress Notes (Signed)
Prenatal Visit Note Date: 08/02/2016 Clinic: [facility]  Subjective:  Melissa Thornton is a 20 y.o. G1P0 at 7154w2d being seen today for ongoing prenatal care.  She is currently monitored for the following issues for this low-risk pregnancy and has Late prenatal care affecting pregnancy in second trimester; Uterine size date discrepancy; and GBS (group B Streptococcus carrier), +RV culture, currently pregnant on her problem list.  Patient reports no complaints.   Contractions: Not present.  .  Movement: Present. Denies leaking of fluid.   The following portions of the patient's history were reviewed and updated as appropriate: allergies, current medications, past family history, past medical history, past social history, past surgical history and problem list. Problem list updated.  Objective:   Vitals:   08/02/16 1359  BP: 117/76  Pulse: (!) 107  Weight: 180 lb 8 oz (81.9 kg)    Fetal Status: Fetal Heart Rate (bpm): 159 Fundal Height: 37 cm Movement: Present  Presentation: Vertex  General:  Alert, oriented and cooperative. Patient is in no acute distress.  Skin: Skin is warm and dry. No rash noted.   Cardiovascular: Normal heart rate noted  Respiratory: Normal respiratory effort, no problems with respiration noted  Abdomen: Soft, gravid, appropriate for gestational age. Pain/Pressure: Present     Pelvic:  Cervical exam deferred        Extremities: Normal range of motion.  Edema: Trace  Mental Status: Normal mood and affect. Normal behavior. Normal judgment and thought content.   Urinalysis:      Assessment and Plan:  Pregnancy: G1P0 at 7054w2d  1. GBS (group B Streptococcus carrier), +RV culture, currently pregnant Informed patient of status.  Preterm labor symptoms and general obstetric precautions including but not limited to vaginal bleeding, contractions, leaking of fluid and fetal movement were reviewed in detail with the patient. Please refer to After  Visit Summary for other counseling recommendations.  Return in about 1 week (around 08/09/2016).   [place] Bingharlie Fady Stamps, MD

## 2016-08-10 ENCOUNTER — Encounter: Payer: Self-pay | Admitting: Obstetrics and Gynecology

## 2016-08-10 ENCOUNTER — Ambulatory Visit (INDEPENDENT_AMBULATORY_CARE_PROVIDER_SITE_OTHER): Payer: Medicaid Other | Admitting: Obstetrics and Gynecology

## 2016-08-10 VITALS — BP 121/87 | HR 102 | Wt 179.0 lb

## 2016-08-10 DIAGNOSIS — O0932 Supervision of pregnancy with insufficient antenatal care, second trimester: Secondary | ICD-10-CM

## 2016-08-10 DIAGNOSIS — Z23 Encounter for immunization: Secondary | ICD-10-CM | POA: Diagnosis not present

## 2016-08-10 DIAGNOSIS — O0933 Supervision of pregnancy with insufficient antenatal care, third trimester: Secondary | ICD-10-CM | POA: Diagnosis not present

## 2016-08-10 DIAGNOSIS — O0930 Supervision of pregnancy with insufficient antenatal care, unspecified trimester: Secondary | ICD-10-CM

## 2016-08-10 DIAGNOSIS — O99820 Streptococcus B carrier state complicating pregnancy: Secondary | ICD-10-CM

## 2016-08-10 NOTE — Patient Instructions (Signed)
Vaginal Delivery °During delivery, your health care provider will help you give birth to your baby. During a vaginal delivery, you will work to push the baby out of your vagina. However, before you can push your baby out, a few things need to happen. The opening of your uterus (cervix) has to soften, thin out, and open up (dilate) all the way to 10 cm. Also, your baby has to move down from the uterus into your vagina.  °SIGNS OF LABOR  °Your health care provider will first need to make sure you are in labor. Signs of labor include:  °· Passing what is called the mucous plug before labor begins. This is a small amount of blood-stained mucus. °· Having regular, painful uterine contractions.   °· The time between contractions gets shorter.   °· The discomfort and pain gradually get more intense. °· Contraction pains get worse when walking and do not go away when resting.   °· Your cervix becomes thinner (effacement) and dilates. °BEFORE THE DELIVERY °Once you are in labor and admitted into the hospital or care center, your health care provider may do the following:  °· Perform a complete physical exam. °· Review any complications related to pregnancy or labor.  °· Check your blood pressure, pulse, temperature, and heart rate (vital signs).   °· Determine if, and when, the rupture of amniotic membranes occurred. °· Do a vaginal exam (using a sterile glove and lubricant) to determine:   °¨ The position (presentation) of the baby. Is the baby's head presenting first (vertex) in the birth canal (vagina), or are the feet or buttocks first (breech)?   °¨ The level (station) of the baby's head within the birth canal.   °¨ The effacement and dilatation of the cervix.   °· An electronic fetal monitor is usually placed on your abdomen when you first arrive. This is used to monitor your contractions and the baby's heart rate. °¨ When the monitor is on your abdomen (external fetal monitor), it can only pick up the frequency and  length of your contractions. It cannot tell the strength of your contractions. °¨ If it becomes necessary for your health care provider to know exactly how strong your contractions are or to see exactly what the baby's heart rate is doing, an internal monitor may be inserted into your vagina and uterus. Your health care provider will discuss the benefits and risks of using an internal monitor and obtain your permission before inserting the device. °¨ Continuous fetal monitoring may be needed if you have an epidural, are receiving certain medicines (such as oxytocin), or have pregnancy or labor complications. °· An IV access tube may be placed into a vein in your arm to deliver fluids and medicines if necessary. °THREE STAGES OF LABOR AND DELIVERY °Normal labor and delivery is divided into three stages. °First Stage °This stage starts when you begin to contract regularly and your cervix begins to efface and dilate. It ends when your cervix is completely open (fully dilated). The first stage is the longest stage of labor and can last from 3 hours to 15 hours.  °Several methods are available to help with labor pain. You and your health care provider will decide which option is best for you. Options include:  °· Opioid medicines. These are strong pain medicines that you can get through your IV tube or as a shot into your muscle. These medicines lessen pain but do not make it go away completely.  °· Epidural. A medicine is given through a thin tube that   is inserted in your back. The medicine numbs the lower part of your body and prevents any pain in that area. °· Paracervical pain medicine. This is an injection of an anesthetic on each side of your cervix.   °· You may request natural childbirth, which does not involve the use of pain medicines or an epidural during labor and delivery. Instead, you will use other things, such as breathing exercises, to help cope with the pain. °Second Stage °The second stage of labor  begins when your cervix is fully dilated at 10 cm. It continues until you push your baby down through the birth canal and the baby is born. This stage can take only minutes or several hours. °· The location of your baby's head as it moves through the birth canal is reported as a number called a station. If the baby's head has not started its descent, the station is described as being at minus 3 (-3). When your baby's head is at the zero station, it is at the middle of the birth canal and is engaged in the pelvis. The station of your baby helps indicate the progress of the second stage of labor. °· When your baby is born, your health care provider may hold the baby with his or her head lowered to prevent amniotic fluid, mucus, and blood from getting into the baby's lungs. The baby's mouth and nose may be suctioned with a small bulb syringe to remove any additional fluid. °· Your health care provider may then place the baby on your stomach. It is important to keep the baby from getting cold. To do this, the health care provider will dry the baby off, place the baby directly on your skin (with no blankets between you and the baby), and cover the baby with warm, dry blankets.   °· The umbilical cord is cut. °Third Stage °During the third stage of labor, your health care provider will deliver the placenta (afterbirth) and make sure your bleeding is under control. The delivery of the placenta usually takes about 5 minutes but can take up to 30 minutes. After the placenta is delivered, a medicine may be given either by IV or injection to help contract the uterus and control bleeding. If you are planning to breastfeed, you can try to do so now. °After you deliver the placenta, your uterus should contract and get very firm. If your uterus does not remain firm, your health care provider will massage it. This is important because the contraction of the uterus helps cut off bleeding at the site where the placenta was attached  to your uterus. If your uterus does not contract properly and stay firm, you may continue to bleed heavily. If there is a lot of bleeding, medicines may be given to contract the uterus and stop the bleeding.  °  °This information is not intended to replace advice given to you by your health care provider. Make sure you discuss any questions you have with your health care provider. °  °Document Released: 08/02/2008 Document Revised: 11/14/2014 Document Reviewed: 06/20/2012 °Elsevier Interactive Patient Education ©2016 Elsevier Inc. ° °

## 2016-08-10 NOTE — Progress Notes (Signed)
Subjective:  Melissa ErichsenDestiny Thornton is a 20 y.o. G1P0 at [redacted]w[redacted]d being seen today for ongoing prenatal care.  She is currently monitored for the following issues for this low-risk pregnancy and has Late prenatal care affecting pregnancy in second trimester and GBS (group B Streptococcus carrier), +RV culture, currently pregnant on her problem list.  Patient reports no complaints.  Contractions: Not present. Vag. Bleeding: None.  Movement: Present. Denies leaking of fluid.   The following portions of the patient's history were reviewed and updated as appropriate: allergies, current medications, past family history, past medical history, past social history, past surgical history and problem list. Problem list updated.  Objective:   Vitals:   08/10/16 1407  BP: 121/87  Pulse: (!) 102  Weight: 179 lb (81.2 kg)    Fetal Status: Fetal Heart Rate (bpm): 154   Movement: Present     General:  Alert, oriented and cooperative. Patient is in no acute distress.  Skin: Skin is warm and dry. No rash noted.   Cardiovascular: Normal heart rate noted  Respiratory: Normal respiratory effort, no problems with respiration noted  Abdomen: Soft, gravid, appropriate for gestational age. Pain/Pressure: Present     Pelvic:  Cervical exam deferred        Extremities: Normal range of motion.  Edema: Trace  Mental Status: Normal mood and affect. Normal behavior. Normal judgment and thought content.   Urinalysis:      Assessment and Plan:  Pregnancy: G1P0 at 2120w3d  1. Late prenatal care  - Flu Vaccine QUAD 36+ mos IM (Fluarix, Quad PF)  2. GBS (group B Streptococcus carrier), +RV culture, currently pregnant Will treat in labor  Term labor symptoms and general obstetric precautions including but not limited to vaginal bleeding, contractions, leaking of fluid and fetal movement were reviewed in detail with the patient. Please refer to After Visit Summary for other counseling recommendations.  Return in about 1 week  (around 08/17/2016) for OB visit.   Hermina StaggersMichael L Ethelyn Cerniglia, MD

## 2016-08-17 ENCOUNTER — Encounter: Payer: Self-pay | Admitting: Advanced Practice Midwife

## 2016-08-17 ENCOUNTER — Ambulatory Visit (INDEPENDENT_AMBULATORY_CARE_PROVIDER_SITE_OTHER): Payer: Medicaid Other | Admitting: Advanced Practice Midwife

## 2016-08-17 VITALS — BP 121/72 | HR 88 | Wt 185.0 lb

## 2016-08-17 DIAGNOSIS — O99820 Streptococcus B carrier state complicating pregnancy: Secondary | ICD-10-CM

## 2016-08-17 NOTE — Progress Notes (Signed)
   PRENATAL VISIT NOTE  Subjective:  Melissa Thornton is a 20 y.o. G1P0 at 4542w3d being seen today for ongoing prenatal care.  She is currently monitored for the following issues for this low-risk pregnancy and has Late prenatal care affecting pregnancy in second trimester and GBS (group B Streptococcus carrier), +RV culture, currently pregnant on her problem list.  Patient reports occasional contractions.  Contractions: Not present. Vag. Bleeding: Scant.  Movement: Present. Denies leaking of fluid.   The following portions of the patient's history were reviewed and updated as appropriate: allergies, current medications, past family history, past medical history, past social history, past surgical history and problem list. Problem list updated.  Objective:   Vitals:   08/17/16 1410  BP: 121/72  Pulse: 88  Weight: 185 lb (83.9 kg)    Fetal Status: Fetal Heart Rate (bpm): 149   Movement: Present     General:  Alert, oriented and cooperative. Patient is in no acute distress.  Skin: Skin is warm and dry. No rash noted.   Cardiovascular: Normal heart rate noted  Respiratory: Normal respiratory effort, no problems with respiration noted  Abdomen: Soft, gravid, appropriate for gestational age. Pain/Pressure: Present     Pelvic:  Cervical exam deferred        Extremities: Normal range of motion.  Edema: Trace  Mental Status: Normal mood and affect. Normal behavior. Normal judgment and thought content.   Urinalysis:      Assessment and Plan:  Pregnancy: G1P0 at 9142w3d  There are no diagnoses linked to this encounter. Term labor symptoms and general obstetric precautions including but not limited to vaginal bleeding, contractions, leaking of fluid and fetal movement were reviewed in detail with the patient. Please refer to After Visit Summary for other counseling recommendations.  RTC 1 week  Aviva SignsMarie L Deannie Resetar, CNM

## 2016-08-17 NOTE — Progress Notes (Signed)
Breastfeeding discussed with patient  

## 2016-08-17 NOTE — Patient Instructions (Signed)
Vaginal Delivery °During delivery, your health care provider will help you give birth to your baby. During a vaginal delivery, you will work to push the baby out of your vagina. However, before you can push your baby out, a few things need to happen. The opening of your uterus (cervix) has to soften, thin out, and open up (dilate) all the way to 10 cm. Also, your baby has to move down from the uterus into your vagina.  °SIGNS OF LABOR  °Your health care provider will first need to make sure you are in labor. Signs of labor include:  °· Passing what is called the mucous plug before labor begins. This is a small amount of blood-stained mucus. °· Having regular, painful uterine contractions.   °· The time between contractions gets shorter.   °· The discomfort and pain gradually get more intense. °· Contraction pains get worse when walking and do not go away when resting.   °· Your cervix becomes thinner (effacement) and dilates. °BEFORE THE DELIVERY °Once you are in labor and admitted into the hospital or care center, your health care provider may do the following:  °· Perform a complete physical exam. °· Review any complications related to pregnancy or labor.  °· Check your blood pressure, pulse, temperature, and heart rate (vital signs).   °· Determine if, and when, the rupture of amniotic membranes occurred. °· Do a vaginal exam (using a sterile glove and lubricant) to determine:   °¨ The position (presentation) of the baby. Is the baby's head presenting first (vertex) in the birth canal (vagina), or are the feet or buttocks first (breech)?   °¨ The level (station) of the baby's head within the birth canal.   °¨ The effacement and dilatation of the cervix.   °· An electronic fetal monitor is usually placed on your abdomen when you first arrive. This is used to monitor your contractions and the baby's heart rate. °¨ When the monitor is on your abdomen (external fetal monitor), it can only pick up the frequency and  length of your contractions. It cannot tell the strength of your contractions. °¨ If it becomes necessary for your health care provider to know exactly how strong your contractions are or to see exactly what the baby's heart rate is doing, an internal monitor may be inserted into your vagina and uterus. Your health care provider will discuss the benefits and risks of using an internal monitor and obtain your permission before inserting the device. °¨ Continuous fetal monitoring may be needed if you have an epidural, are receiving certain medicines (such as oxytocin), or have pregnancy or labor complications. °· An IV access tube may be placed into a vein in your arm to deliver fluids and medicines if necessary. °THREE STAGES OF LABOR AND DELIVERY °Normal labor and delivery is divided into three stages. °First Stage °This stage starts when you begin to contract regularly and your cervix begins to efface and dilate. It ends when your cervix is completely open (fully dilated). The first stage is the longest stage of labor and can last from 3 hours to 15 hours.  °Several methods are available to help with labor pain. You and your health care provider will decide which option is best for you. Options include:  °· Opioid medicines. These are strong pain medicines that you can get through your IV tube or as a shot into your muscle. These medicines lessen pain but do not make it go away completely.  °· Epidural. A medicine is given through a thin tube that   is inserted in your back. The medicine numbs the lower part of your body and prevents any pain in that area. °· Paracervical pain medicine. This is an injection of an anesthetic on each side of your cervix.   °· You may request natural childbirth, which does not involve the use of pain medicines or an epidural during labor and delivery. Instead, you will use other things, such as breathing exercises, to help cope with the pain. °Second Stage °The second stage of labor  begins when your cervix is fully dilated at 10 cm. It continues until you push your baby down through the birth canal and the baby is born. This stage can take only minutes or several hours. °· The location of your baby's head as it moves through the birth canal is reported as a number called a station. If the baby's head has not started its descent, the station is described as being at minus 3 (-3). When your baby's head is at the zero station, it is at the middle of the birth canal and is engaged in the pelvis. The station of your baby helps indicate the progress of the second stage of labor. °· When your baby is born, your health care provider may hold the baby with his or her head lowered to prevent amniotic fluid, mucus, and blood from getting into the baby's lungs. The baby's mouth and nose may be suctioned with a small bulb syringe to remove any additional fluid. °· Your health care provider may then place the baby on your stomach. It is important to keep the baby from getting cold. To do this, the health care provider will dry the baby off, place the baby directly on your skin (with no blankets between you and the baby), and cover the baby with warm, dry blankets.   °· The umbilical cord is cut. °Third Stage °During the third stage of labor, your health care provider will deliver the placenta (afterbirth) and make sure your bleeding is under control. The delivery of the placenta usually takes about 5 minutes but can take up to 30 minutes. After the placenta is delivered, a medicine may be given either by IV or injection to help contract the uterus and control bleeding. If you are planning to breastfeed, you can try to do so now. °After you deliver the placenta, your uterus should contract and get very firm. If your uterus does not remain firm, your health care provider will massage it. This is important because the contraction of the uterus helps cut off bleeding at the site where the placenta was attached  to your uterus. If your uterus does not contract properly and stay firm, you may continue to bleed heavily. If there is a lot of bleeding, medicines may be given to contract the uterus and stop the bleeding.  °  °This information is not intended to replace advice given to you by your health care provider. Make sure you discuss any questions you have with your health care provider. °  °Document Released: 08/02/2008 Document Revised: 11/14/2014 Document Reviewed: 06/20/2012 °Elsevier Interactive Patient Education ©2016 Elsevier Inc. ° °

## 2016-08-18 ENCOUNTER — Inpatient Hospital Stay (HOSPITAL_COMMUNITY)
Admission: AD | Admit: 2016-08-18 | Discharge: 2016-08-20 | DRG: 775 | Disposition: A | Discharge status: Home / Self Care | Payer: Medicaid Other | Source: Ambulatory Visit | Attending: Obstetrics & Gynecology | Admitting: Obstetrics & Gynecology

## 2016-08-18 ENCOUNTER — Encounter (HOSPITAL_COMMUNITY): Payer: Self-pay

## 2016-08-18 DIAGNOSIS — Z3483 Encounter for supervision of other normal pregnancy, third trimester: Secondary | ICD-10-CM | POA: Diagnosis present

## 2016-08-18 DIAGNOSIS — Z3A38 38 weeks gestation of pregnancy: Secondary | ICD-10-CM

## 2016-08-18 DIAGNOSIS — O99824 Streptococcus B carrier state complicating childbirth: Principal | ICD-10-CM | POA: Diagnosis present

## 2016-08-18 LAB — TYPE AND SCREEN
ABO/RH(D): O POS
Antibody Screen: NEGATIVE

## 2016-08-18 LAB — CBC
HCT: 38.2 % (ref 36.0–46.0)
HEMOGLOBIN: 13.1 g/dL (ref 12.0–15.0)
MCH: 28.2 pg (ref 26.0–34.0)
MCHC: 34.3 g/dL (ref 30.0–36.0)
MCV: 82.3 fL (ref 78.0–100.0)
Platelets: 270 10*3/uL (ref 150–400)
RBC: 4.64 MIL/uL (ref 3.87–5.11)
RDW: 14.2 % (ref 11.5–15.5)
WBC: 7.8 10*3/uL (ref 4.0–10.5)

## 2016-08-18 LAB — ABO/RH: ABO/RH(D): O POS

## 2016-08-18 LAB — RPR: RPR: NONREACTIVE

## 2016-08-18 MED ORDER — PANTOPRAZOLE SODIUM 20 MG PO TBEC
20.0000 mg | DELAYED_RELEASE_TABLET | Freq: Every day | ORAL | Status: DC
  Administered 2016-08-19 – 2016-08-20 (×2): 20 mg via ORAL
  Filled 2016-08-18 (×4): qty 1

## 2016-08-18 MED ORDER — LACTATED RINGERS IV SOLN
INTRAVENOUS | Status: DC
  Administered 2016-08-18: 09:00:00 via INTRAVENOUS

## 2016-08-18 MED ORDER — SIMETHICONE 80 MG PO CHEW: 80.0000 mg | CHEWABLE_TABLET | ORAL | Status: DC | PRN

## 2016-08-18 MED ORDER — ACETAMINOPHEN 325 MG PO TABS: 650.0000 mg | ORAL_TABLET | ORAL | Status: DC | PRN

## 2016-08-18 MED ORDER — ONDANSETRON HCL 4 MG/2ML IJ SOLN: 4.0000 mg | INTRAMUSCULAR | Status: DC | PRN

## 2016-08-18 MED ORDER — COCONUT OIL OIL: 1.0000 "application " | TOPICAL_OIL | Status: DC | PRN

## 2016-08-18 MED ORDER — WITCH HAZEL-GLYCERIN EX PADS: 1.0000 "application " | MEDICATED_PAD | CUTANEOUS | Status: DC | PRN

## 2016-08-18 MED ORDER — DIPHENHYDRAMINE HCL 25 MG PO CAPS: 25.0000 mg | ORAL_CAPSULE | Freq: Four times a day (QID) | ORAL | Status: DC | PRN

## 2016-08-18 MED ORDER — ONDANSETRON HCL 4 MG PO TABS: 4.0000 mg | ORAL_TABLET | ORAL | Status: DC | PRN

## 2016-08-18 MED ORDER — PRENATAL MULTIVITAMIN CH
1.0000 | ORAL_TABLET | Freq: Every day | ORAL | Status: DC
  Administered 2016-08-18 – 2016-08-19 (×2): 1 via ORAL
  Filled 2016-08-18 (×2): qty 1

## 2016-08-18 MED ORDER — VITAMIN K1 1 MG/0.5ML IJ SOLN
INTRAMUSCULAR | Status: AC
Start: 2016-08-18 — End: 2016-08-18
  Filled 2016-08-18: qty 0.5

## 2016-08-18 MED ORDER — SODIUM CHLORIDE 0.9 % IV SOLN
2.0000 g | Freq: Once | INTRAVENOUS | Status: AC
  Administered 2016-08-18: 2 g via INTRAVENOUS
  Filled 2016-08-18: qty 2000

## 2016-08-18 MED ORDER — ZOLPIDEM TARTRATE 5 MG PO TABS
5.0000 mg | ORAL_TABLET | Freq: Every evening | ORAL | Status: DC | PRN
Start: 2016-08-18 — End: 2016-08-20

## 2016-08-18 MED ORDER — DIBUCAINE 1 % RE OINT: 1.0000 "application " | TOPICAL_OINTMENT | RECTAL | Status: DC | PRN

## 2016-08-18 MED ORDER — OXYCODONE-ACETAMINOPHEN 5-325 MG PO TABS: 2.0000 | ORAL_TABLET | ORAL | Status: DC | PRN

## 2016-08-18 MED ORDER — IBUPROFEN 600 MG PO TABS
600.0000 mg | ORAL_TABLET | Freq: Four times a day (QID) | ORAL | Status: DC
  Administered 2016-08-18 – 2016-08-20 (×8): 600 mg via ORAL
  Filled 2016-08-18 (×9): qty 1

## 2016-08-18 MED ORDER — ONDANSETRON HCL 4 MG/2ML IJ SOLN: 4.0000 mg | Freq: Four times a day (QID) | INTRAMUSCULAR | Status: DC | PRN

## 2016-08-18 MED ORDER — TETANUS-DIPHTH-ACELL PERTUSSIS 5-2.5-18.5 LF-MCG/0.5 IM SUSP: 0.5000 mL | Freq: Once | INTRAMUSCULAR | Status: DC

## 2016-08-18 MED ORDER — OXYTOCIN BOLUS FROM INFUSION
500.0000 mL | Freq: Once | INTRAVENOUS | Status: AC
  Administered 2016-08-18: 500 mL via INTRAVENOUS

## 2016-08-18 MED ORDER — BENZOCAINE-MENTHOL 20-0.5 % EX AERO: 1.0000 "application " | INHALATION_SPRAY | CUTANEOUS | Status: DC | PRN

## 2016-08-18 MED ORDER — LIDOCAINE HCL (PF) 1 % IJ SOLN
30.0000 mL | INTRAMUSCULAR | Status: DC | PRN
  Filled 2016-08-18: qty 30

## 2016-08-18 MED ORDER — OXYTOCIN 40 UNITS IN LACTATED RINGERS INFUSION - SIMPLE MED
2.5000 [IU]/h | INTRAVENOUS | Status: DC
  Filled 2016-08-18: qty 1000

## 2016-08-18 MED ORDER — OXYCODONE-ACETAMINOPHEN 5-325 MG PO TABS: 1.0000 | ORAL_TABLET | ORAL | Status: DC | PRN

## 2016-08-18 MED ORDER — LACTATED RINGERS IV SOLN: 500.0000 mL | INTRAVENOUS | Status: DC | PRN

## 2016-08-18 MED ORDER — SENNOSIDES-DOCUSATE SODIUM 8.6-50 MG PO TABS
2.0000 | ORAL_TABLET | ORAL | Status: DC
  Administered 2016-08-18 – 2016-08-20 (×2): 2 via ORAL
  Filled 2016-08-18 (×2): qty 2

## 2016-08-18 MED ORDER — SOD CITRATE-CITRIC ACID 500-334 MG/5ML PO SOLN: 30.0000 mL | ORAL | Status: DC | PRN

## 2016-08-18 NOTE — H&P (Signed)
LABOR AND DELIVERY ADMISSION HISTORY AND PHYSICAL NOTE  Melissa Thornton is a 20 y.o. female G1P1001 with IUP at [redacted]w[redacted]d by LMP confirmed with anatomy scan presenting for spontaneous onset of labor.   She reports positive fetal movement. She denies leakage of fluid or vaginal bleeding.  Prenatal History/Complications:  Past Medical History: Past Medical History:  Diagnosis Date  . Medical history non-contributory     Past Surgical History: Past Surgical History:  Procedure Laterality Date  . NO PAST SURGERIES      Obstetrical History: OB History    Gravida Para Term Preterm AB Living   1 1 1     1    SAB TAB Ectopic Multiple Live Births         0 1      Social History: Social History   Social History  . Marital status: Single    Spouse name: N/A  . Number of children: N/A  . Years of education: N/A   Social History Main Topics  . Smoking status: Never Smoker  . Smokeless tobacco: Never Used  . Alcohol use No  . Drug use: No  . Sexual activity: Yes    Birth control/ protection: None   Other Topics Concern  . None   Social History Narrative  . None    Family History: Family History  Problem Relation Age of Onset  . Cancer Neg Hx   . Diabetes Neg Hx   . Hypertension Neg Hx   . Miscarriages / Stillbirths Neg Hx   . Birth defects Neg Hx     Allergies: No Known Allergies  Prescriptions Prior to Admission  Medication Sig Dispense Refill Last Dose  . pantoprazole (PROTONIX) 20 MG tablet Take 1 tablet (20 mg total) by mouth daily. 30 tablet 2 Taking  . Prenatal Vit-Fe Fumarate-FA (PRENATAL VITAMINS PLUS) 27-1 MG TABS Take 1 tablet by mouth daily. 30 tablet 6 Taking     Review of Systems   All systems reviewed and negative except as stated in HPI  Blood pressure 128/82, pulse 80, temperature 98.2 F (36.8 C), temperature source Oral, resp. rate 20, height 4\' 11"  (1.499 m), weight 185 lb (83.9 kg), last menstrual period 11/22/2015, unknown if currently  breastfeeding. General appearance: alert, cooperative and appears stated age Lungs: clear to auscultation bilaterally Heart: regular rate and rhythm Abdomen: soft, non-tender; bowel sounds normal Extremities: No calf swelling or tenderness Presentation: cephalic by cervical exam.  Fetal monitoring: category 1 Uterine activity: contracts 3-5 minutes Dilation: 10 Effacement (%): 100 Station: +1 Exam by:: Philipp Deputy   Prenatal labs: ABO, Rh: --/--/O POS (10/12 0840) Antibody: NEG (10/12 0840) Rubella: !Error! RPR: NON REAC (08/02 1009)  HBsAg: NEGATIVE (06/06 0001)  HIV: NONREACTIVE (08/02 1009)  GBS:   positive 1 hr Glucola: 111 Genetic screening:  Not obtained   Prenatal Transfer Tool  Maternal Diabetes: No Genetic Screening: Declined Maternal Ultrasounds/Referrals: Normal Fetal Ultrasounds or other Referrals:  None Maternal Substance Abuse:  No Significant Maternal Medications:  None Significant Maternal Lab Results: Lab values include: Group B Strep positive  Results for orders placed or performed during the hospital encounter of 08/18/16 (from the past 24 hour(s))  CBC   Collection Time: 08/18/16  8:40 AM  Result Value Ref Range   WBC 7.8 4.0 - 10.5 K/uL   RBC 4.64 3.87 - 5.11 MIL/uL   Hemoglobin 13.1 12.0 - 15.0 g/dL   HCT 91.4 78.2 - 95.6 %   MCV 82.3 78.0 - 100.0  fL   MCH 28.2 26.0 - 34.0 pg   MCHC 34.3 30.0 - 36.0 g/dL   RDW 16.114.2 09.611.5 - 04.515.5 %   Platelets 270 150 - 400 K/uL  [facility]   Collection Time: 08/18/16  8:40 AM  Result Value Ref Range   ABO/RH(D) O POS    Antibody Screen NEG    Sample Expiration 08/21/2016     Patient Active Problem List   Diagnosis Date Noted  . Labor and delivery indication for care or intervention 08/18/2016  . GBS (group B Streptococcus carrier), +RV culture, currently pregnant 08/02/2016  . Late prenatal care affecting pregnancy in second trimester 04/12/2016     Assessment: Melissa Thornton is a 20 y.o. G1P1001 at 8375w4d here for SOL  #Labor:expectant mangment #Pain: Desires epidural #FWB: Category 1 #ID:  GBS positive given AMpicillin #MOF: breast #MOC:mirena #Circ:  no  [name] 08/18/2016, 10:39 AM

## 2016-08-18 NOTE — Progress Notes (Signed)
Dr Genevie AnnSchenk notified of pt's VE, orders received to admit pt

## 2016-08-18 NOTE — MAU Note (Signed)
Ctx since last night. Denies SROM reports some vag bleeding

## 2016-08-18 NOTE — Lactation Note (Signed)
This note was copied from a baby's chart. Lactation Consultation Note  Patient Name: Girl Franchot ErichsenDestiny Deere ZOXWR'UToday's Date: 08/18/2016 Reason for consult: Initial assessment Baby at 7 hr of life. Upon entry baby was laying sts on mom. Mom stated baby had just come off the breast. She denies breast or nipple pain, voiced no concerns. Nipples appear normal. Baby has a heart shaped tongue tip, visible thin tight lingual frenulum with an anterior insertion point. Baby does not extend tongue well, it stays bunched in mouth but the tip can slightly extend over gum ridge. Baby can not lift tongue to midline. Baby has moderate lateralization of tongue. The frenulum was not mentioned to the parents at this time. Mom was instructed to call lactation at the next feeding to determine if baby is getting a deep latch and is able to move milk. Discussed baby behavior, feeding frequency, baby belly size, voids, wt loss, breast changes, and nipple care. Demonstrated manual expression, colostrum noted bilaterally, spoon in room. Given lactation handouts. Aware of OP services and support group.    Maternal Data Has patient been taught Hand Expression?: Yes Does the patient have breastfeeding experience prior to this delivery?: No  Feeding Feeding Type: Breast Fed Length of feed: 15 min  LATCH Score/Interventions                      Lactation Tools Discussed/Used WIC Program: No (plans on singing up)   Consult Status Consult Status: Follow-up Date: 08/18/16 Follow-up type: In-patient    Rulon Eisenmengerlizabeth E Talan Gildner 08/18/2016, 4:45 PM

## 2016-08-19 NOTE — Progress Notes (Signed)
Patient ID: Melissa Thornton, female   DOB: [birth year], 20 y.o.   MRN: [id number] Post Partum Day 1  Subjective:  Melissa Thornton is a 20 y.o. G1P1001 4498w4d s/p SVD.  No acute events overnight.  Pt denies problems with ambulating, voiding or po intake.  Pain is well controlled.  She has not had flatus. She has not had bowel movement.  Lochia small.  Plan for birth control is IUD.  Method of Feeding: breastfeeding.   Objective: BP (!) 103/51 (BP Location: Right Arm)   Pulse 87   Temp 97.7 F (36.5 C) (Oral)   Resp 18   Ht 4\' 11"  (1.499 m)   Wt 83.9 kg (185 lb)   LMP 11/22/2015 (Exact Date)   Breastfeeding? Unknown   BMI 37.37 kg/m   Physical Exam:  General: alert, cooperative and no distress Lochia:normal flow Abdomen: declined Uterine Fundus: declined   Recent Labs  08/18/16 0840  HGB 13.1  HCT 38.2    Assessment/Plan:  ASSESSMENT: Melissa Thornton is a 20 y.o. G1P1001 4998w4d ppd #1 s/p NSVD doing well.   Plan for discharge tomorrow, Breastfeeding and Contraception IUD   LOS: 1 day   Harvin HazelChristina Rizk 08/19/2016, 7:40 AM   I have seen and examined this patient and have written my own note on her

## 2016-08-19 NOTE — Progress Notes (Signed)
CSW acknowledged consult.  LPNC for CSW consult is 28 weeks. Per MOB's chart PNC ws initiated at 20 weeks.   Tamula Morrical Boyd-Gilyard, MSW, LCSW Clinical Social Work [phone]   

## 2016-08-19 NOTE — Progress Notes (Addendum)
Post Partum Day 1 Subjective: no complaints, up ad lib, voiding and tolerating PO, small lochia, plans to breastfeed, IUD  Objective: Blood pressure 126/86, pulse 60, temperature 98 F (36.7 C), temperature source Oral, resp. rate 18, height 4\' 11"  (1.499 m), weight 83.9 kg (185 lb), last menstrual period 11/22/2015, unknown if currently breastfeeding.  Physical Exam:  General: alert, cooperative and no distress Lochia:normal flow Chest: CTAB Heart: RRR no m/r/g Abdomen: +BS, soft, nontender,  Uterine Fundus: firm DVT Evaluation: No evidence of DVT seen on physical exam. Extremities: no edema   Recent Labs  08/18/16 0840  HGB 13.1  HCT 38.2    Assessment/Plan: Plan for discharge tomorrow   LOS: 1 day   CRESENZO-DISHMAN,Ema Hebner 08/19/2016, 9:12 AM

## 2016-08-19 NOTE — Progress Notes (Signed)
CSW acknowledged consult.  [facility] Per [facility]'s chart PNC ws initiated at 20 weeks.   Blaine HamperAngel Boyd-Gilyard, MSW, LCSW Clinical Social Work [phone]

## 2016-08-20 MED ORDER — IBUPROFEN 600 MG PO TABS: 600.0000 mg | ORAL_TABLET | Freq: Four times a day (QID) | ORAL | 0 refills | Status: DC

## 2016-08-20 MED ORDER — DOCUSATE SODIUM 100 MG PO CAPS: 100.0000 mg | ORAL_CAPSULE | Freq: Two times a day (BID) | ORAL | 0 refills | Status: DC

## 2016-08-20 NOTE — Progress Notes (Signed)
CSW acknowledged consult for MOB. Consult screened out due to PNC for MOB initiated at [redacted]w[redacted]d.  Late PNC for CSW consult is [redacted]w[redacted]d.  Melissa Thornton, MSW, LCSW Clinical Social Work [phone]  

## 2016-08-20 NOTE — Lactation Note (Signed)
This note was copied from a baby's chart. Lactation Consultation Note  Patient Name: Girl Franchot ErichsenDestiny Thornton UEAVW'UToday's Date: 08/20/2016 Reason for consult: Follow-up assessment;Infant < 6lbs Mom reports baby is nursing well, denies nipple tenderness. LC was not able to observe latch, Mom reported baby recently BF. Per chart review baby has been to breast 12 times in past 24 hours 10-20 minutes. 2 voids/2 stools in past 24 hours. 4 voids/5 stools in life.  Weight loss from 3.9% to 3.2% in past 24 hours. Encouraged Mom to BF on demand but try to start BF both breasts some feeding. Advised baby should be at breast 8-12 times or more in 24 hours.  Encouraged to keep baby stimulated at breast to nurse longer periods 15-20 minutes, both breasts some feedings. Engorgement care reviewed if needed, Hand pump given, 24 flange fits well, cleaning reviewed. Advised of OP services and support group. Encouraged Mom to call for LC to observe latch before d/c.   Maternal Data    Feeding Feeding Type: Breast Fed Length of feed: 10 min  LATCH Score/Interventions                      Lactation Tools Discussed/Used Tools: Pump Breast pump type: Manual   Consult Status Consult Status: Follow-up Date: 08/20/16 Follow-up type: In-patient    Alfred LevinsGranger, Anel Creighton Ann 08/20/2016, 10:25 AM

## 2016-08-20 NOTE — Discharge Instructions (Signed)
Postpartum Care After Vaginal Delivery °After you deliver your newborn (postpartum period), the usual stay in the hospital is 24-72 hours. If there were problems with your labor or delivery, or if you have other medical problems, you might be in the hospital longer.  °While you are in the hospital, you will receive help and instructions on how to care for yourself and your newborn during the postpartum period.  °While you are in the hospital: °· Be sure to tell your nurses if you have pain or discomfort, as well as where you feel the pain and what makes the pain worse. °· If you had an incision made near your vagina (episiotomy) or if you had some tearing during delivery, the nurses may put ice packs on your episiotomy or tear. The ice packs may help to reduce the pain and swelling. °· If you are breastfeeding, you may feel uncomfortable contractions of your uterus for a couple of weeks. This is normal. The contractions help your uterus get back to normal size. °· It is normal to have some bleeding after delivery. °¨ For the first 1-3 days after delivery, the flow is red and the amount may be similar to a period. °¨ It is common for the flow to start and stop. °¨ In the first few days, you may pass some small clots. Let your nurses know if you begin to pass large clots or your flow increases. °¨ Do not  flush blood clots down the toilet before having the nurse look at them. °¨ During the next 3-10 days after delivery, your flow should become more watery and pink or brown-tinged in color. °¨ Ten to fourteen days after delivery, your flow should be a small amount of yellowish-white discharge. °¨ The amount of your flow will decrease over the first few weeks after delivery. Your flow may stop in 6-8 weeks. Most women have had their flow stop by 12 weeks after delivery. °· You should change your sanitary pads frequently. °· Wash your hands thoroughly with soap and water for at least 20 seconds after changing pads, using  the toilet, or before holding or feeding your newborn. °· You should feel like you need to empty your bladder within the first 6-8 hours after delivery. °· In case you become weak, lightheaded, or faint, call your nurse before you get out of bed for the first time and before you take a shower for the first time. °· Within the first few days after delivery, your breasts may begin to feel tender and full. This is called engorgement. Breast tenderness usually goes away within 48-72 hours after engorgement occurs. You may also notice milk leaking from your breasts. If you are not breastfeeding, do not stimulate your breasts. Breast stimulation can make your breasts produce more milk. °· Spending as much time as possible with your newborn is very important. During this time, you and your newborn can feel close and get to know each other. Having your newborn stay in your room (rooming in) will help to strengthen the bond with your newborn.  It will give you time to get to know your newborn and become comfortable caring for your newborn. °· Your hormones change after delivery. Sometimes the hormone changes can temporarily cause you to feel sad or tearful. These feelings should not last more than a few days. If these feelings last longer than that, you should talk to your caregiver. °· If desired, talk to your caregiver about methods of family planning or contraception. °·   Talk to your caregiver about immunizations. Your caregiver may want you to have the following immunizations before leaving the hospital:  Tetanus, diphtheria, and pertussis (Tdap) or tetanus and diphtheria (Td) immunization. It is very important that you and your family (including grandparents) or others caring for your newborn are up-to-date with the Tdap or Td immunizations. The Tdap or Td immunization can help protect your newborn from getting ill.  Rubella immunization.  Varicella (chickenpox) immunization.  Influenza immunization. You should  receive this annual immunization if you did not receive the immunization during your pregnancy.   This information is not intended to replace advice given to you by your health care provider. Make sure you discuss any questions you have with your health care provider.   Document Released: 08/21/2007 Document Revised: 07/18/2012 Document Reviewed: 06/20/2012 Elsevier Interactive Patient Education 2016 [employer]   Storing Breast Milk Breast milk is a living fluid that contains infection-fighting cells (antibodies). Pre-pumped (expressed) breast milk needs to be stored in a certain way so that it remains effective in protecting your baby against infections. The following guidelines are for storing breast milk for a healthy, full-term infant.  HOW LONG CAN BREAST MILK BE STORED?  Milk can be stored for up to 4 hours at room temperature, or 60-35F (15.6-19.4C). However, it is acceptable to allow the milk to sit for 6-8 hours if the pump parts and containers are well cleaned.  Milk can be stored for 3 days in a refrigerator at less than 20F (3.9C). However, it is acceptable to allow the milk to sit for up to 8 days if the pump parts and containers are well cleaned.  Milk can be stored for 2 weeks in a freezer compartment inside a refrigerator.  Milk can be stored for 3-4 months in a freezer unit with a separate door.  Milk can be stored for 6-12 months in a deep freezer at -50F (-20C). A deep freezer is a chest or stand-alone freezer that is not opened very often and stays at a colder temperature. HOW SHOULD I STORE BREAST MILK?  Milk may be stored in a:  Glass container.  Hard plastic container.  Plastic bag specially designed for storing milk. Many women like these because they take up less space and can be attached directly to the breast pump.  Store your milk in 2-4 oz (60-120 mL) servings. This makes it easier to thaw the milk. It also helps you avoid having to throw out milk  your baby does not drink.  Leave an inch or so at the top of the bag or bottle so the milk has room to expand as it freezes.  Label each container with the date and time the milk was pumped so that you use the milk in the order it was pumped.  If you will be freezing the milk, store it in the back of the freezer. This prevents the milk from being affected by temperature changes due to the freezer door being opened. THAWING FROZEN BREAST MILK  Frozen milk can be thawed:  In a refrigerator.  Under warm-running tap water.  In a pan of warm water that has been heated on the stove.  Do not heat milk directly on the stove or in a microwave as this will destroy some of its infection-fighting properties.  Thawed milk can be stored in the refrigerator for up to 24 hours but should not be refrozen.  If you want to add freshly pumped milk to the frozen  milk, make sure to add less milk than what is already frozen. Chill the fresh milk in your refrigerator for 30 minutes before adding it to the milk in the freezer.   This information is not intended to replace advice given to you by your health care provider. Make sure you discuss any questions you have with your health care provider.   Document Released: 08/21/2009 Document Revised: 10/29/2013 Document Reviewed: 08/05/2013 Elsevier Interactive Patient Education [employer]2016 Elsevier Inc.

## 2016-08-20 NOTE — Discharge Summary (Signed)
OB Discharge Summary     Patient Name: Melissa ErichsenDestiny Pais DOB: 01-05-1996 MRN: [id number]  Date of admission: 08/18/2016 Delivering MD: Lorne SkeensSCHENK, NICHOLAS MICHAEL   Date of discharge: 08/20/2016  Admitting diagnosis: 38WKS,LABOR Intrauterine pregnancy: 2670w4d     Secondary diagnosis:  Active Problems:   Labor and delivery indication for care or intervention  Additional problems: None     Discharge diagnosis: Term Pregnancy Delivered                                                                                                Post partum procedures:None  Augmentation: AROM  Complications: None  Hospital course:  Onset of Labor With Vaginal Delivery     20 y.o. yo G1P1001 at 4070w4d was admitted in Active Labor on 08/18/2016. Patient had an uncomplicated labor course as follows:  Membrane Rupture Time/Date: 9:03 AM ,08/18/2016   Intrapartum Procedures: Episiotomy: None [1]                                         Lacerations:  Periurethral [8]  Patient had a delivery of a Viable infant. 08/18/2016  Information for the patient's newborn:  Iris PertWright, Girl Evangelyne [[id number]][[id number]]  Delivery Method: Vag-Spont    Pateint had an uncomplicated postpartum course.  She is ambulating, tolerating a regular diet, passing flatus, and urinating well. Patient is discharged home in stable condition on 08/20/16.    Physical exam Vitals:   08/18/16 2354 08/19/16 0808 08/19/16 1837 08/20/16 0554  BP: (!) 103/51 126/86 118/60 117/74  Pulse: 87 60 79 68  Resp: 18 18 18 19   Temp: 97.7 F (36.5 C) 98 F (36.7 C) 98 F (36.7 C) 98.4 F (36.9 C)  TempSrc: Oral Oral Oral Oral  Weight:      Height:       General: alert, cooperative and no distress Lochia: appropriate Uterine Fundus: firm Incision: N/A DVT Evaluation: No evidence of DVT seen on physical exam. Negative Homan's sign. No cords or calf tenderness. Labs: Lab Results  Component Value Date   WBC 7.8 08/18/2016   HGB 13.1 08/18/2016   HCT 38.2 08/18/2016   MCV 82.3 08/18/2016   PLT 270 08/18/2016   No flowsheet data found.  Discharge instruction: per After Visit Summary and "Baby and Me Booklet".  After visit meds:    Medication List    STOP taking these medications   pantoprazole 20 MG tablet Commonly known as:  PROTONIX     TAKE these medications   docusate sodium 100 MG capsule Commonly known as:  COLACE Take 1 capsule (100 mg total) by mouth 2 (two) times daily.   ibuprofen 600 MG tablet Commonly known as:  ADVIL,MOTRIN Take 1 tablet (600 mg total) by mouth every 6 (six) hours.   PRENATAL VITAMINS PLUS 27-1 MG Tabs Take 1 tablet by mouth daily.       Diet: routine diet  Activity: Advance as tolerated. Pelvic rest for 6 weeks.   Outpatient follow up:6 weeks Follow  up Appt:Future Appointments Date Time Provider Department Center  10/04/2016 1:20 PM Donette Larry, CNM [facility]   Follow up Visit:No Follow-up on file.  Postpartum contraception: IUD Mirena  Newborn Data: Live born female  Birth Weight: 5 lb 15.8 oz (2716 g) APGAR: 8, 9  Baby Feeding: Breast Disposition:home with mother   08/20/2016 Jen Mow, DO

## 2016-08-24 ENCOUNTER — Encounter: Payer: Medicaid Other | Admitting: Advanced Practice Midwife

## 2016-10-04 ENCOUNTER — Encounter: Payer: Self-pay | Admitting: Certified Nurse Midwife

## 2016-10-04 ENCOUNTER — Ambulatory Visit (INDEPENDENT_AMBULATORY_CARE_PROVIDER_SITE_OTHER): Payer: Medicaid Other | Admitting: Certified Nurse Midwife

## 2016-10-04 DIAGNOSIS — Z30430 Encounter for insertion of intrauterine contraceptive device: Secondary | ICD-10-CM | POA: Diagnosis not present

## 2016-10-04 DIAGNOSIS — Z3202 Encounter for pregnancy test, result negative: Secondary | ICD-10-CM

## 2016-10-04 LAB — POCT PREGNANCY, URINE: PREG TEST UR: NEGATIVE

## 2016-10-04 NOTE — Progress Notes (Addendum)
Subjective:     Melissa Thornton is a 20 y.o. female who presents for a postpartum visit. She is 6 weeks postpartum following a spontaneous vaginal delivery. I have fully reviewed the prenatal and intrapartum course. The delivery was at 38 gestational weeks. Outcome: spontaneous vaginal delivery. Anesthesia: none. Postpartum course has been unremarkable. Baby's course has been unremarkable. Baby is feeding by breast. Bleeding staining only. Bowel function is normal. Bladder function is normal. Patient is not sexually active. Contraception method is IUD. Postpartum depression screening: negative.  The following portions of the patient's history were reviewed and updated as appropriate: allergies, current medications, past family history, past medical history, past social history, past surgical history and problem list.  Review of Systems Pertinent items are noted in HPI.   Objective:    BP 103/70 (BP Location: Right Arm, Patient Position: Sitting, Cuff Size: Large)   Pulse 82   Wt 169 lb 11.2 oz (77 kg)   Breastfeeding? Yes   BMI 34.28 kg/m   General:  alert, cooperative and no distress   Breasts:    Lungs: regular rate and effort  Heart:  Regular rate  Abdomen:    Vulva:  normal  Vagina: normal vagina  Cervix:  anteverted  Corpus:   Adnexa:    Rectal Exam:         Results for orders placed or performed in visit on 10/04/16 (from the past 24 hour(s))  Pregnancy, urine POC     Status: None   Collection Time: 10/04/16  2:17 PM  Result Value Ref Range   Preg Test, Ur NEGATIVE NEGATIVE    Procedure Note: Pt consented for IUD insertion.  Pt placed in lithotomy position.  Speculum inserted and cervix cleaned with betadine solution.  Uterus sounded to 8 cm; Liletta IUD inserted without difficulty. Strings cut at approximately 3 cm.  Speculum removed. Tolerated well.  Assessment:     Normal postpartum exam.    Pap smear not done at today's visit.     IUD insertion Plan:    1.  Contraception: IUD  2. Follow up in: prn

## 2018-02-20 ENCOUNTER — Encounter: Payer: Self-pay | Admitting: *Deleted

## 2018-04-23 ENCOUNTER — Ambulatory Visit (INDEPENDENT_AMBULATORY_CARE_PROVIDER_SITE_OTHER): Payer: Self-pay | Admitting: Medical

## 2018-04-23 ENCOUNTER — Encounter: Payer: Self-pay | Admitting: Medical

## 2018-04-23 VITALS — BP 130/75 | HR 95 | Ht 59.0 in | Wt 176.6 lb

## 2018-04-23 DIAGNOSIS — Z30431 Encounter for routine checking of intrauterine contraceptive device: Secondary | ICD-10-CM

## 2018-04-23 NOTE — Progress Notes (Signed)
History:  Melissa Thornton is a 22 y.o. G1P1001 who presents to clinic today with complaint about IUD. Patient states that she has noted pain after intercourse only in the lower abdomen radiating from the vaginal area. She denies pain otherwise. She is pleased with the minimal bleeding of the IUD and would like to keep it for birth control. She is also concerned that the strings are irritating her new partner.    The following portions of the patient's history were reviewed and updated as appropriate: allergies, current medications, family history, past medical history, social history, past surgical history and problem list.  Review of Systems:  Review of Systems  Constitutional: Negative for fever.  Gastrointestinal: Negative for abdominal pain.  Genitourinary:       Neg - vaginal bleeding, discharge      Objective:  Physical Exam BP 130/75   Pulse 95   Ht 4\' 11"  (1.499 m)   Wt 176 lb 9.6 oz (80.1 kg)   BMI 35.67 kg/m  Physical Exam  Constitutional: She is oriented to person, place, and time. She appears well-developed and well-nourished. No distress.  HENT:  Head: Normocephalic.  Cardiovascular: Normal rate.  Pulmonary/Chest: Effort normal.  Abdominal: Soft.  Genitourinary: Cervix exhibits no friability. No bleeding in the vagina. Vaginal discharge (scant, white) found.  Neurological: She is alert and oriented to person, place, and time.  Skin: Skin is warm and dry. No erythema.  Psychiatric: She has a normal mood and affect.  Vitals reviewed.  MDM Patient counseled on benefits of IUD for birth control. Would like to try trimming IUD strings today to alleviate discomfort for her partner.  Discussed position changes for intercourse to alleviate post coital pain.    Assessment & Plan:  1. IUD check up - Strings trimmed to the level of the cervix today  - Patient may return if problems continue or as needed for other GYN issues    Kathlene CoteWenzel, Julie N, PA-C 04/23/2018 5:19  PM

## 2018-04-23 NOTE — Progress Notes (Signed)
Pt reports having abdominal cramps x1 month. She also reports that her partner feels the IUD strings during intercourse. Pt wants the device removed for these reasons.  She has not decided on another type of birth control. She does not desire another pregnancy at this time or anytime in the near future.

## 2018-04-23 NOTE — Patient Instructions (Signed)

## 2018-06-06 ENCOUNTER — Ambulatory Visit: Payer: Self-pay | Admitting: Student

## 2018-12-17 ENCOUNTER — Ambulatory Visit: Payer: Medicaid Other | Admitting: Nurse Practitioner

## 2018-12-19 ENCOUNTER — Ambulatory Visit (INDEPENDENT_AMBULATORY_CARE_PROVIDER_SITE_OTHER): Payer: Medicaid Other | Admitting: Medical

## 2018-12-19 ENCOUNTER — Encounter: Payer: Self-pay | Admitting: Medical

## 2018-12-19 ENCOUNTER — Other Ambulatory Visit (HOSPITAL_COMMUNITY)
Admission: RE | Admit: 2018-12-19 | Discharge: 2018-12-19 | Disposition: A | Discharge status: Home / Self Care | Payer: Medicaid Other | Source: Ambulatory Visit | Attending: Medical | Admitting: Medical

## 2018-12-19 VITALS — BP 120/78 | HR 88 | Ht 59.0 in | Wt 210.7 lb

## 2018-12-19 DIAGNOSIS — Z30432 Encounter for removal of intrauterine contraceptive device: Secondary | ICD-10-CM

## 2018-12-19 DIAGNOSIS — Z3009 Encounter for other general counseling and advice on contraception: Secondary | ICD-10-CM | POA: Diagnosis not present

## 2018-12-19 DIAGNOSIS — Z01419 Encounter for gynecological examination (general) (routine) without abnormal findings: Secondary | ICD-10-CM | POA: Insufficient documentation

## 2018-12-19 MED ORDER — NORGESTIMATE-ETH ESTRADIOL 0.25-35 MG-MCG PO TABS: 1.0000 | ORAL_TABLET | Freq: Every day | ORAL | 11 refills | Status: DC

## 2018-12-19 NOTE — Progress Notes (Signed)
Pt states wants BC Pills. 

## 2018-12-19 NOTE — Progress Notes (Signed)
Subjective:    Kashvi Osuch is a 23 y.o. female who presents for an annual exam. The patient would like IUD removed today due to pain with intercourse. The patient is sexually active. GYN screening history: no prior history of gyn screening tests. The patient wears seatbelts: yes. The patient participates in regular exercise: no. Has the patient ever been transfused or tattooed?: not asked. The patient reports that there is not domestic violence in her life.   Menstrual History: OB History    Gravida  1   Para  1   Term  1   Preterm      AB      Living  1     SAB      TAB      Ectopic      Multiple  0   Live Births  1           No LMP recorded. (Menstrual status: IUD).    The following portions of the patient's history were reviewed and updated as appropriate: allergies, current medications, past family history, past medical history, past social history, past surgical history and problem list.  Review of Systems Pertinent items are noted in HPI.    Objective:   Physical Exam  Nursing note and vitals reviewed. Constitutional: She is oriented to person, place, and time. She appears well-developed and well-nourished. No distress.  HENT:  Head: Normocephalic and atraumatic.  Neck: Neck supple. No thyromegaly present.  Cardiovascular: Normal rate, regular rhythm and normal heart sounds.  No murmur heard. Respiratory: Effort normal and breath sounds normal. No respiratory distress. She has no wheezes.  GI: Soft. Bowel sounds are normal. She exhibits no distension and no mass. There is no abdominal tenderness. There is no rebound and no guarding.  Genitourinary: Uterus is not enlarged and not tender. Cervix exhibits no motion tenderness, no discharge and no friability. Right adnexum displays no mass and no tenderness. Left adnexum displays no mass and no tenderness.    Vaginal discharge (small white) present.     No vaginal bleeding.  No bleeding in the vagina.   Neurological: She is alert and oriented to person, place, and time.  Skin: Skin is warm and dry. No erythema.  Psychiatric: She has a normal mood and affect.    GYNECOLOGY CLINIC PROCEDURE NOTE  Ms. Auriana Rearick is a 23 y.o. G1P1001 here for Lyletta IUD removal.  IUD Removal  Patient was in the dorsal lithotomy position, normal external genitalia was noted.  A speculum was placed in the patient's vagina, normal discharge was noted, no lesions. The multiparous cervix was visualized, no lesions, no abnormal discharge.  The strings of the IUD were grasped and pulled using ring forceps. The IUD was removed in its entirety. The strings of the IUD were not visualized, so Kelly forceps were introduced into the endometrial cavity and the IUD was grasped and removed in its entirety. Patient tolerated the procedure well.    Patient will use OCPs for contraception     Assessment:    Healthy female exam.   IUD removal  Unwanted fertility    Plan:     Await pap smear results.   Rx for OCPs sent to patient's pharmacy Return to [facility] for annual exam in 1 year or sooner PRN  Kathlene Cote 12/19/2018 9:14 AM

## 2018-12-19 NOTE — Patient Instructions (Signed)

## 2018-12-20 LAB — CYTOLOGY - PAP
Adequacy: ABSENT
CHLAMYDIA, DNA PROBE: NEGATIVE
DIAGNOSIS: NEGATIVE
NEISSERIA GONORRHEA: NEGATIVE

## 2018-12-25 ENCOUNTER — Encounter: Payer: Self-pay | Admitting: *Deleted

## 2019-05-13 DIAGNOSIS — L03012 Cellulitis of left finger: Secondary | ICD-10-CM | POA: Diagnosis not present

## 2019-06-12 DIAGNOSIS — Z6841 Body Mass Index (BMI) 40.0 and over, adult: Secondary | ICD-10-CM | POA: Diagnosis not present

## 2019-06-12 DIAGNOSIS — N926 Irregular menstruation, unspecified: Secondary | ICD-10-CM | POA: Diagnosis not present

## 2020-04-27 ENCOUNTER — Ambulatory Visit (INDEPENDENT_AMBULATORY_CARE_PROVIDER_SITE_OTHER): Payer: Medicaid Other

## 2020-04-27 ENCOUNTER — Other Ambulatory Visit: Payer: Self-pay

## 2020-04-27 DIAGNOSIS — Z3201 Encounter for pregnancy test, result positive: Secondary | ICD-10-CM

## 2020-04-27 DIAGNOSIS — Z3200 Encounter for pregnancy test, result unknown: Secondary | ICD-10-CM

## 2020-04-27 LAB — POCT PREGNANCY, URINE: Preg Test, Ur: POSITIVE — AB

## 2020-04-27 NOTE — Progress Notes (Signed)
Pt came into office to leave urine specimen for UPT; result is positive. I connected with  [name] on 04/27/20 at 1547 by telephone and verified that I am speaking with the correct person using two identifiers. Positive UPT result given. Pt reports last LMP of 03/27/20, pt is 4w 3d today with EDD of 01/01/21. Reports feeling well this pregnancy other than some tenderness to both breasts, states this is worse than previous pregnancy. Recommended trying warm baths and wearing a supportive bra. Pt would like to receive prenatal care in our office, I explained front office will call to schedule new OB appts. Medications and allergies reviewed with pt. Pt agrees to follow up with office as needed before her initial prenatal visit.   Marjo Bicker, RN 04/27/2020  3:47 PM

## 2020-04-27 NOTE — Progress Notes (Signed)
Patient was assessed and managed by nursing staff during this encounter. I have reviewed the chart and agree with the documentation and plan. I have also made any necessary editorial changes.  Marylen Ponto, NP 04/27/2020 4:02 PM

## 2020-06-03 ENCOUNTER — Telehealth (INDEPENDENT_AMBULATORY_CARE_PROVIDER_SITE_OTHER): Payer: Medicaid Other | Admitting: *Deleted

## 2020-06-03 ENCOUNTER — Other Ambulatory Visit: Payer: Self-pay

## 2020-06-03 DIAGNOSIS — O99210 Obesity complicating pregnancy, unspecified trimester: Secondary | ICD-10-CM | POA: Insufficient documentation

## 2020-06-03 DIAGNOSIS — E669 Obesity, unspecified: Secondary | ICD-10-CM

## 2020-06-03 DIAGNOSIS — O0990 Supervision of high risk pregnancy, unspecified, unspecified trimester: Secondary | ICD-10-CM | POA: Insufficient documentation

## 2020-06-03 DIAGNOSIS — Z3490 Encounter for supervision of normal pregnancy, unspecified, unspecified trimester: Secondary | ICD-10-CM

## 2020-06-03 MED ORDER — BLOOD PRESSURE KIT DEVI: 1.0000 | 0 refills | Status: AC | PRN

## 2020-06-03 NOTE — Progress Notes (Signed)
I connected with  [name] on 06/03/20 at  9:15 AM EDT by virtually that I am speaking with the correct person using two identifiers.   I discussed the limitations, risks, security and privacy concerns of performing an evaluation and management service by virtually and the availability of in person appointments. I also discussed with the patient that there may be a patient responsible charge related to this service. The patient expressed understanding and agreed to proceed.  I explained I am completing her New OB Intake today. We discussed Her EDD and that it is based on  sure LMP . I reviewed her allergies, meds, OB History, Medical /Surgical history, and appropriate screenings. I informed her of [facility] services. She is G2P1001 , low risk.   I explained I will send her the [employer] app and app was sent to her while on phone.  She will download after virtual visit . I explained we will have her take her blood pressure weekly during her pregnancy. She confirmed she does not have a cuff and has [employer]. I explained I will  send a blood pressure cuff to [facility] that will fill that prescription  And  confirmed she will pick up the cuff.  I asked her to bring the blood pressure cuff with her to her first ob appointment so we can show her how to use it. Explained  then we will have her take her blood pressure weekly and enter into the app.  I explained she will have some visits in office and some virtually. She already has [employer].  I reviewed her new ob  appointment date/ time with her , our location and to wear mask.  I explained she will have a pelvic exam, ob bloodwork, hemoglobin a1C, cbg , ( had recent pap), and  genetic testing if desired,- she is undecided about a panorama. I did ask if she is interested in information for [facility] and she declined.   I explained I will schedule an [place] at 19 weeks and she will see  the appointment in MyChart. She voices understanding.    Omere Marti,RN 06/03/2020  9:23 AM

## 2020-06-03 NOTE — Patient Instructions (Signed)

## 2020-06-08 ENCOUNTER — Other Ambulatory Visit: Payer: Self-pay

## 2020-06-08 ENCOUNTER — Other Ambulatory Visit (HOSPITAL_COMMUNITY)
Admission: RE | Admit: 2020-06-08 | Discharge: 2020-06-08 | Disposition: A | Discharge status: Home / Self Care | Payer: Medicaid Other | Source: Ambulatory Visit | Attending: Nurse Practitioner | Admitting: Nurse Practitioner

## 2020-06-08 ENCOUNTER — Ambulatory Visit (INDEPENDENT_AMBULATORY_CARE_PROVIDER_SITE_OTHER): Payer: Medicaid Other | Admitting: Student

## 2020-06-08 ENCOUNTER — Encounter: Payer: Self-pay | Admitting: Student

## 2020-06-08 DIAGNOSIS — Z3490 Encounter for supervision of normal pregnancy, unspecified, unspecified trimester: Secondary | ICD-10-CM | POA: Insufficient documentation

## 2020-06-08 LAB — POCT URINALYSIS DIP (DEVICE)
Bilirubin Urine: NEGATIVE
Glucose, UA: NEGATIVE mg/dL
Hgb urine dipstick: NEGATIVE
Ketones, ur: NEGATIVE mg/dL
Leukocytes,Ua: NEGATIVE
Nitrite: NEGATIVE
Protein, ur: NEGATIVE mg/dL
Specific Gravity, Urine: 1.025 (ref 1.005–1.030)
Urobilinogen, UA: 0.2 mg/dL (ref 0.0–1.0)
pH: 6 (ref 5.0–8.0)

## 2020-06-08 NOTE — Progress Notes (Signed)
  Subjective:    Melissa Thornton is being seen today for her first obstetrical visit.  This is not a planned pregnancy. She is at [redacted]w[redacted]d gestation. Her obstetrical history is significant for nothing. . Relationship with FOB: spouse, living together. Patient does intend to breast feed. Pregnancy history fully reviewed.  Patient reports no complaints.  Review of Systems:   Review of Systems  Constitutional: Negative.   HENT: Negative.   Respiratory: Negative.   Cardiovascular: Negative.   Gastrointestinal: Negative.   Neurological: Negative.     Objective:     BP 107/74   Pulse 86   Wt (!) 215 lb 3.2 oz (97.6 kg)   LMP 03/27/2020 (Exact Date)   BMI 43.47 kg/m  Physical Exam Constitutional:      Appearance: Normal appearance.  HENT:     Head: Normocephalic.  Cardiovascular:     Pulses: Normal pulses.  Pulmonary:     Effort: Pulmonary effort is normal.  Abdominal:     General: Abdomen is flat.  Musculoskeletal:        General: Normal range of motion.  Skin:    General: Skin is warm and dry.  Neurological:     Mental Status: She is alert.     Exam    Assessment:    Pregnancy: G2P1001 Patient Active Problem List   Diagnosis Date Noted  . Supervision of low-risk pregnancy 06/03/2020  . Obesity in pregnancy 06/03/2020       Plan:     Initial labs drawn. Prenatal vitamins. Problem list reviewed and updated. AFP3 discussed: will draw. Role of ultrasound in pregnancy discussed; fetal survey: ordered. Amniocentesis discussed: not indicated. Follow up in 4 weeks for 8 weeks. 75% of 60 min visit spent on counseling and coordination of care.  -Just put [employer] App on her phone -My Chart is active  -Will draw genetic testing in 4 weeks, due to patient's BMI and fact that she is only 2 days over 10 weeks means may have inconclusive results. Patient agrees to return in 4 weeks for lab draw only.   Charlesetta Garibaldi [facility] 06/08/2020

## 2020-06-08 NOTE — Patient Instructions (Signed)
AREA PEDIATRIC/FAMILY PRACTICE PHYSICIANS  [place] ([address]) . [facility] o Chambliss, MD; Eniola, MD; Hale, MD; Hensel, MD; McDiarmid, MD; McIntyer, MD; Neal, MD; Walden, MD o [address] [place], [place] [address] o [phone] o Mon-Fri 8:30-12:30, 1:30-5:00 o Providers come to see babies at [facility] o Accepting Medicaid . [facility] o Limited providers who accept newborns: Koirala, MD; Morrow, MD; Wolters, MD o [address], [place], [place] [address] o [phone] o Mon-Fri 8:00-5:30 o Babies seen by providers at [facility] o Does NOT accept Medicaid o Please call early in hospitalization for appointment (limited availability)  . [facility] o Mulberry, MD o [address] [place], [place] [address] o [phone] o Mon, Tue, Thur, Fri 8:30-5:00, Wed 10:00-7:00 (closed 1-2pm) o Babies seen by [facility] providers o Accepting Medicaid . [facility] Pediatrician o Rubin, MD o [address], [place], [place] [address] o [phone] o Mon-Fri 8:30-5:00, Sat 8:30-12:00 o Provider comes to see babies at [facility] o Accepting Medicaid o Must have been referred from current patients or contacted office prior to delivery . [facility] ([facility] o Brown, MD; Chandler, MD; Ettefagh, MD; Grant, MD; Lester, MD; McCormick, MD; McQueen, MD; Prose, MD; Simha, MD; Stanley, MD; Stryffeler, NP; Tebben, NP o [address], [place], [place] [address] o [phone] o Mon, Tue, Thur, Fri 8:30-5:30, Wed 9:30-5:30, Sat 8:30-12:30 o Babies seen by [facility] providers o Accepting Medicaid o Only accepting infants of first-time parents or siblings of current patients o Hospital discharge coordinator will make follow-up appointment . Jack Amos o [address],  [place], [place]  [address] o [phone]   Fax - [phone] . [facility] o [address], [place], [place]  [address] o Phone - [phone]   Fax - [phone] . Shilpa Gosrani o [address], [place], [place]  [address] o [phone]  [place] ([address]) . [facility] o Bates, MD; [place]assfield, MD; Cooper, Cox, MD; MD; Davis, MD; Dovico, MD; Ettefaugh, MD; Little, MD; Lowe, MD; Keiffer, MD; Melvin, MD; Sumner, MD; Williams, MD o [address], [place], [place] [address] o [phone] o Mon-Fri 8:30-5:00 (extended evenings Mon-Thur as needed), Sat-Sun 10:00-1:00 o Providers come to see babies at [facility] o Accepting Medicaid for families of first-time babies and families with all children in the household age 3 and under. Must register with office prior to making appointment (M-F only). . [facility] o Henson, NP; Knapp, MD; Lalonde, MD; Tysinger, PA o [address] [place], [place] [address] o [phone] o Mon-Fri 8:00-5:00 o Babies seen by providers at [facility] o Does NOT accept Medicaid/Commercial Insurance Only . [facility] ([facility]  o Artis, MD; Barnes, MD; Bratton, MD; Coccaro, MD; Lockett Gardner, MD; Kramer, MD; Marshall, MD; Netherton, MD; Poleto, MD; Skinner, MD o [address] [place], [place] [address] o [phone] o Mon-Fri 8:30-5:30, Sat (Oct.-Mar.) 9:00-1:00 o Babies seen by providers at [facility] o Accepting Medicaid  [place] ([address]) . [facility] o Reid, MD; Warner, MD o [address], , [place] [address] o [phone] o Mon-Fri 8:30-5:00, Sat 8:30-12:00 o Providers come to see babies at [facility] o Does NOT accept Medicaid . [facility] o Becker, PA; Hagler, MD; Scifres, PA; Sun, MD; Swayne, MD o [address],  [place], [place] [address] o [phone] o Mon-Fri 8:00-5:00 o Babies seen by providers at [facility] o Does NOT accept Medicaid o Only accepting babies of parents who are patients o Please call early in hospitalization for appointment (limited availability) . [facility] o Clark, MD; Frye, MD; Kelleher, MD; Mack, NP; Miller, MD; O'Keller, MD; Patterson, NP; Pudlo, MD; Puzio, MD; Thomas, MD; Tucker, MD; Twiselton, MD o [address], [place], [place] [address] o [phone] o Mon-Fri 8:00-5:00, Sat 9:00-12:00 o Providers come to see babies at [facility] o Does NOT accept Medicaid  [place] ([address]) . [facility] o Limited providers accepting new patients: Brake, NP; Wharton, PA o [address], [place], [place] [address] o [phone] o Mon-Fri 8:00-5:00 o Babies seen by providers at [facility] o Does NOT accept Medicaid o Only accepting babies of parents who are patients o Please call early in hospitalization for appointment (limited availability) . [facility] o Gay, MD; Quinlan, MD o [address] [place], [place] [address] o [phone] (press [phone] to schedule appointment) o Mon-Fri 8:00-5:00 o Providers come to see babies at [facility] o Does NOT accept Medicaid . [facility] o Mazer, MD o [address] [place], [place] [address] o [phone] o Mon-Fri 8:30-5:00 (lunch 12:30-1:00), extended hours by appointment only Wed 5:00-6:30 o Babies seen by [facility] providers o Accepting Medicaid . [facility] o Banks, MD; Jordan, MD; Koberlein, MD o [address], [place], [place] [address] o [phone] o Mon-Fri 8:00-5:00 o Babies seen by [facility] providers o Does NOT accept Medicaid . [facility] o Parker, MD; Hunter, MD; Wallace, DO o [address] [place], [place]  [address] o [phone] o Mon-Fri 8:00-5:00 o Babies seen by [facility] providers o Does NOT accept Medicaid . [facility] o Brandon, PA; Brecken, PA; Christy, NP; Dees, MD; DeClaire, MD; DeWeese, MD; Hansen, NP; Mills, NP; Parrish, NP; Smoot, NP; Summer, MD; Vapne, MD o [address] [place], [place] [address] o [phone] o Mon-Fri 8:30-5:00, Sat 10:00-1:00 o Providers come to see babies at [facility] o Does NOT accept Medicaid o Free prenatal information session Tuesdays at 4:45pm . [facility] o Bouska, MD; Gordon, PA; Jeffery, PA; Weber, PA o [address] [place] [address] o [phone] o Mon-Fri 7:30-5:30 o Babies seen by [facility] providers . [facility] o [address], [place], [place]  [address] o [phone]   Fax - [phone]  [place] ([address]) . [facility] o [place]ese, [place] o [address] [place], [place] [address] o [phone] o Mon-Thur 8:00-6:00 o Providers come to see babies at [facility] o Accepting Medicaid . [facility] o Anderson, NP; Badger, MD; Beal, PA; Spencer, PA o [address] [place], [place] [address] o [phone] o Mon-Thur 7:30-7:30, Fri 7:30-4:30 o Babies seen by [facility] providers o Accepting Medicaid . [facility] o Agbuya, MD; Klett, NP; Romgoolam, MD o [address], [place],  [address] o [phone] o Mon-Fri 8:30-5:00, Sat 8:30-12:00 o Providers come to see babies at [facility] o Accepting Medicaid o Must have "Meet & Greet" appointment at office prior to delivery . [facility] o McCord,   MD; Wallace, MD; Wood, MD o [address], [place], [place] [address] o [phone] o Mon-Wed 8:00-6:00, Thur-Fri 8:00-5:00, Sat 9:00-12:00 o Providers come to  see babies at [facility] o Does NOT accept Medicaid o Only accepting siblings of current patients . [facility]  o [address], [place], [place]  [address] o [phone]   Fax - [phone] . [facility] o [address], [place], [place]  [address] o [phone]   Fax - [phone]  [place] ([address]) . [facility] o Cirigliano, DO; Matthews, DO o [address] [place], [place] [address] o [phone] o Mon-Fri 7:00-5:00 o Babies seen by [facility] providers o Does NOT accept Medicaid . [facility] o Briscoe, MD; Howley, PA; Moreira, PA o [address], [place], [place] [address] o [phone] o Mon-Fri 8:00-5:00 o Babies seen by [facility] providers o Accepting Medicaid . [facility] o Boyd, MD; Church, PA; Jones, NP; Osborn, PA o [address], , [place] [address] o [phone] o Mon-Fri 8:00-5:00 o Babies seen by providers at [facility] o Accepting Medicaid  [place] ([address]) . [facility] [place] o Wendling, DO o [address] [place], [place] [address] o [phone] o Mon-Fri 8:00-5:00 o Babies seen by [facility] providers o Does NOT accept Medicaid o Limited availability, please call early in hospitalization to schedule follow-up . [facility] o Calderon, PA; Cummings, MD; Dillard, MD; Martin, PA; Olson, MD; VanDeven, PA o [address], [place], [place] [address] o [phone] o Mon-Fri 8:30-5:00, Sat 9:00-12:00 o Babies seen by providers at [facility] o Accepting Medicaid o Please register online then schedule online or call office o [email or web address] . [facility] ([facility] o Hunter, NP; Kumar, MD; Martin Rogers, PA o [address], [place], [place] [address] o [phone] o Mon-Fri 8:00-5:00 o Babies seen by providers at [facility] o Accepting Medicaid . [facility] ([facility] o Sikes, MD; Kristi Fleenor, NP; West, MD o [address], [place], [place] [address] o [phone] o Mon-Fri 8:00-5:30, Sat&Sun by appointment (phones open at 8:30) o Babies seen by [facility] providers o Accepting Medicaid o Must be a first-time baby or sibling of current patient . [facility] [place]  o [address], [place], [place]  [address] o [phone]   Fax - [phone]  [place] ([address]) . [facility] o Brown, PA; Cowen, PA; Rice, MD; Helton, PA; Spry, MD o [address] [place], [place] [address] o [phone] o Mon-Thur 8:00-7:00, Fri 8:00-5:00, Sat 8:00-12:00, Sun 9:00-12:00 o Babies seen by [facility] providers o Accepting Medicaid . [facility] o Coe-Goins, MD; Marshall, MD; Pierre-Louis, MD o [address], [place], [place] [address] o [phone] o Mon-Thur 8:00-5:00 o Babies seen by providers at [facility] o Accepting Medicaid . [facility] o Bratton, MD; Coe-Goins, MD; Hayes, MD; Lewis, MD; List, MD; Lott, MD; Marshall, MD; Moran, MD; O'Neal, MD; Pierre-Louis, MD; Pitonzo, MD; Scholer, MD; Spangle, MD o [address] [place],    [address] o [phone] o Mon-Fri 8:00-5:30, Sat (Oct.-Mar.) 9:00-1:00 o Babies seen by providers at [facility] o Accepting Medicaid o Must fill out new patient packet, available online at [email or web address] . [facility] ([facility] o Friddle, NP; Harris, NP; Kelly, NP; Logan, MD;  Melvin, PA; Poth, MD; Ramadoss, MD; Stanton, NP o [address], [place], [place] [address] o [phone] o Mon-Thur 8:00-5:30, Fri 8:00-5:00 o Babies seen by providers at [facility] o Accepting Medicaid  [place] ([address]) . [facility] o Dixon, PA; , MD; Pickard, MD; Tapia, PA o [address], [place], [place] [address] o [phone] o Mon-Fri 8:00-5:00 o Babies seen by providers at [facility] o Accepting Medicaid   [place] ([address]) . [facility] [place] o Masneri, DO; Meyers, MD; Nelson, PA o [address], [place], [place] [address] o [phone] o Mon-Fri 8:00-5:00 o Babies seen by providers at [facility] o Does NOT accept Medicaid o Limited appointment availability, please call early in hospitalization  . [facility] [place] o Kunedd, DO; McGowen, MD o [address], [place], [place] [address] o [phone] o Mon-Fri 8:00-5:00 o Babies seen by [facility] providers o Does NOT accept Medicaid . [facility] [place] o Cameron, MD; MacDonald, MD; Michaels, PA; Nayak, MD o [address] [place] [address], [place], [place] [address] o [phone] o Mon-Fri 8:00-5:00 o After hours clinic ([address] [place], [place] [address]) [phone] Mon-Fri 5:00-8:00, Sat 12:00-6:00, Sun 10:00-4:00 o Babies seen by [facility] providers o Accepting Medicaid . [facility] [place] o [address], [place], [place]  [address] o [phone]   Fax - [phone]  [place] ([address]) . [facility] [place] [facility] o Andy, MD o [address], [place], [place] [address] o [phone] o Mon-Fri 8:00-5:00 o Babies seen by [facility] providers o Does NOT accept Medicaid . [facility] [place] ([facility] [place]) o Eksir, MD o [address], [place], [place]  [address] o [phone] o Mon-Thur 8:00-7:00, Fri 8:00-5:00, Sat 8:00-12:00 o Babies seen by providers at [facility] o Accepting Medicaid - but does not have vaccinations in office (must be received elsewhere) o Limited availability, please call early in hospitalization  [place] ([address]) . [facility]  o Charlene Flemming, MD o [address], [place]  [address] o [phone]  Fax [phone]   

## 2020-06-09 LAB — CBC/D/PLT+RPR+RH+ABO+RUB AB...
Antibody Screen: NEGATIVE
Basophils Absolute: 0 10*3/uL (ref 0.0–0.2)
Basos: 0 %
EOS (ABSOLUTE): 0.1 10*3/uL (ref 0.0–0.4)
Eos: 1 %
HCV Ab: <0.1 s/co ratio (ref 0.0–0.9)
HIV Screen 4th Generation wRfx: NONREACTIVE
Hematocrit: 37.5 % (ref 34.0–46.6)
Hemoglobin: 12.1 g/dL (ref 11.1–15.9)
Hepatitis B Surface Ag: NEGATIVE
Immature Grans (Abs): 0 10*3/uL (ref 0.0–0.1)
Immature Granulocytes: 0 %
Lymphocytes Absolute: 1.4 10*3/uL (ref 0.7–3.1)
Lymphs: 18 %
MCH: 26.2 pg — ABNORMAL LOW (ref 26.6–33.0)
MCHC: 32.3 g/dL (ref 31.5–35.7)
MCV: 81 fL (ref 79–97)
Monocytes Absolute: 0.6 10*3/uL (ref 0.1–0.9)
Monocytes: 8 %
Neutrophils Absolute: 5.4 10*3/uL (ref 1.4–7.0)
Neutrophils: 73 %
Platelets: 382 10*3/uL (ref 150–450)
RBC: 4.61 x10E6/uL (ref 3.77–5.28)
RDW: 13 % (ref 11.7–15.4)
RPR Ser Ql: NONREACTIVE
Rh Factor: POSITIVE
Rubella Antibodies, IGG: 5.02 index (ref >0.99)
WBC: 7.5 10*3/uL (ref 3.4–10.8)

## 2020-06-09 LAB — GC/CHLAMYDIA PROBE AMP (~~LOC~~) NOT AT ARMC
Chlamydia: NEGATIVE
Comment: NEGATIVE
Comment: NORMAL
Neisseria Gonorrhea: NEGATIVE

## 2020-06-09 LAB — HCV INTERPRETATION

## 2020-06-10 LAB — URINE CULTURE, OB REFLEX

## 2020-06-10 LAB — CULTURE, OB URINE

## 2020-06-11 NOTE — Progress Notes (Signed)
Chart reviewed for nurse visit. Agree with plan of care.   Sendy Pluta Lorraine, CNM 06/11/2020 1:45 PM   

## 2020-06-29 DIAGNOSIS — Z3490 Encounter for supervision of normal pregnancy, unspecified, unspecified trimester: Secondary | ICD-10-CM | POA: Diagnosis not present

## 2020-07-06 ENCOUNTER — Other Ambulatory Visit: Payer: Medicaid Other

## 2020-07-06 ENCOUNTER — Other Ambulatory Visit: Payer: Self-pay

## 2020-07-06 ENCOUNTER — Ambulatory Visit (INDEPENDENT_AMBULATORY_CARE_PROVIDER_SITE_OTHER): Payer: Medicaid Other

## 2020-07-06 VITALS — BP 113/61 | HR 87 | Wt 214.9 lb

## 2020-07-06 DIAGNOSIS — Z0130 Encounter for examination of blood pressure without abnormal findings: Secondary | ICD-10-CM

## 2020-07-06 NOTE — Progress Notes (Signed)
Pt here today for AFP lab draw. Pt is too early in pregnancy for this lab. Explained to patient. Pt reports she does have a concern regarding persistent headache. Reports hx of migraine since last pregnancy managed with otc Excedrin. No hx of hypertension. Pt reports checking BP at home; WNL. Unable to log into [employer] app; RN will contact [employer] for tech support. Reports some improvement with Tylenol 500 mg. BP today is 113/61.  Reviewed with Debroah Loop, MD who states pt may try Excedrin. Pt given provider recommendation and encouraged to follow-up with office as needed and if HA does not improve.    Fleet Contras RN 07/06/20

## 2020-07-10 ENCOUNTER — Encounter: Payer: Self-pay | Admitting: Family Medicine

## 2020-08-03 ENCOUNTER — Ambulatory Visit (INDEPENDENT_AMBULATORY_CARE_PROVIDER_SITE_OTHER): Payer: Medicaid Other | Admitting: Student

## 2020-08-03 ENCOUNTER — Other Ambulatory Visit: Payer: Self-pay

## 2020-08-03 VITALS — BP 111/76 | HR 88 | Wt 217.5 lb

## 2020-08-03 DIAGNOSIS — Z3492 Encounter for supervision of normal pregnancy, unspecified, second trimester: Secondary | ICD-10-CM

## 2020-08-03 DIAGNOSIS — Z3A18 18 weeks gestation of pregnancy: Secondary | ICD-10-CM

## 2020-08-03 NOTE — Progress Notes (Signed)
   PRENATAL VISIT NOTE  Subjective:  Melissa Thornton is a 24 y.o. G2P1001 at [redacted]w[redacted]d being seen today for ongoing prenatal care.  She is currently monitored for the following issues for this low-risk pregnancy and has Supervision of low-risk pregnancy and Obesity in pregnancy on their problem list.  Patient reports no complaints.  Contractions: Not present. Vag. Bleeding: None.  Movement: Present. Denies leaking of fluid.   The following portions of the patient's history were reviewed and updated as appropriate: allergies, current medications, past family history, past medical history, past social history, past surgical history and problem list.   Objective:   Vitals:   08/03/20 1055  BP: 111/76  Pulse: 88  Weight: 217 lb 8 oz (98.7 kg)    Fetal Status: Fetal Heart Rate (bpm): 151   Movement: Present     General:  Alert, oriented and cooperative. Patient is in no acute distress.  Skin: Skin is warm and dry. No rash noted.   Cardiovascular: Normal heart rate noted  Respiratory: Normal respiratory effort, no problems with respiration noted  Abdomen: Soft, gravid, appropriate for gestational age.  Pain/Pressure: Absent     Pelvic: Cervical exam deferred        Extremities: Normal range of motion.  Edema: None  Mental Status: Normal mood and affect. Normal behavior. Normal judgment and thought content.   Assessment and Plan:  Pregnancy: G2P1001 at [redacted]w[redacted]d 1. Encounter for supervision of low-risk pregnancy in second trimester -declined genetic testing, does not want AFP\ -she has been chekcing BP daily; all normal. None above 140/90; she will start checking weekly. Reviewed importance of monitoring BP and why  Preterm labor symptoms and general obstetric precautions including but not limited to vaginal bleeding, contractions, leaking of fluid and fetal movement were reviewed in detail with the patient. Please refer to After Visit Summary for other counseling recommendations.   Return in  about 4 weeks (around 08/31/2020), or LROB on My Chart.  Future Appointments  Date Time Provider Department Center  08/07/2020  7:30 AM [facility] NURSE [facility]  08/07/2020  7:45 AM [facility] US4 [facility]  08/31/2020  9:15 AM Gerrit Heck, CNM [facility]    Marylene Land, CNM

## 2020-08-03 NOTE — Patient Instructions (Signed)

## 2020-08-07 ENCOUNTER — Other Ambulatory Visit: Payer: Self-pay

## 2020-08-07 ENCOUNTER — Ambulatory Visit: Payer: Medicaid Other | Attending: Student

## 2020-08-07 ENCOUNTER — Other Ambulatory Visit: Payer: Self-pay | Admitting: *Deleted

## 2020-08-07 ENCOUNTER — Ambulatory Visit: Payer: Medicaid Other | Admitting: *Deleted

## 2020-08-07 ENCOUNTER — Encounter: Payer: Self-pay | Admitting: *Deleted

## 2020-08-07 DIAGNOSIS — O99210 Obesity complicating pregnancy, unspecified trimester: Secondary | ICD-10-CM

## 2020-08-07 DIAGNOSIS — Z6841 Body Mass Index (BMI) 40.0 and over, adult: Secondary | ICD-10-CM

## 2020-08-07 DIAGNOSIS — Z3490 Encounter for supervision of normal pregnancy, unspecified, unspecified trimester: Secondary | ICD-10-CM | POA: Diagnosis not present

## 2020-08-31 ENCOUNTER — Other Ambulatory Visit: Payer: Self-pay

## 2020-08-31 ENCOUNTER — Ambulatory Visit (INDEPENDENT_AMBULATORY_CARE_PROVIDER_SITE_OTHER): Payer: Medicaid Other

## 2020-08-31 VITALS — BP 119/71 | HR 93 | Wt 219.4 lb

## 2020-08-31 DIAGNOSIS — Z3A21 21 weeks gestation of pregnancy: Secondary | ICD-10-CM

## 2020-08-31 DIAGNOSIS — Z3492 Encounter for supervision of normal pregnancy, unspecified, second trimester: Secondary | ICD-10-CM

## 2020-08-31 NOTE — Progress Notes (Signed)
   LOW-RISK PREGNANCY OFFICE VISIT  Patient name: Melissa Thornton MRN [id number]  Date of birth: 1996-03-20 Chief Complaint:   Routine Prenatal Visit  Subjective:   Melissa Thornton is a 24 y.o. G18P1001 female at [redacted]w[redacted]d with an Estimated Date of Delivery: 01/07/21 being seen today for ongoing management of a low-risk pregnancy aeb has Supervision of low-risk pregnancy and Obesity in pregnancy on their problem list.  Patient presents today without complaints. Patient endorses fetal movement.  Patient denies abdominal cramping and contractions as well as vaginal concerns including abnormal discharge, leaking of fluid, and bleeding. Patient states she is considering another PPIUD and would like a method that is "the longest."  However, patient does not desire menses.   Contractions: Not present. Vag. Bleeding: None.  Movement: Present.  Reviewed past medical,surgical, social, obstetrical and family history as well as problem list, medications and allergies.  Objective   Vitals:   08/31/20 0918  BP: 119/71  Pulse: 93  Weight: 219 lb 6.4 oz (99.5 kg)  Body mass index is 44.31 kg/m.  Total Weight Gain:9 lb 6.4 oz (4.264 kg)         Physical Examination:   General appearance: Well appearing, and in no distress  Mental status: Alert, oriented to person, place, and time  Skin: Warm & dry  Cardiovascular: Normal heart rate noted  Respiratory: Normal respiratory effort, no distress  Abdomen: Soft, gravid, nontender, AGA with Fundal height of Fundal Height: 24 cm  Pelvic: Cervical exam deferred           Extremities: Edema: None  Fetal Status: Fetal Heart Rate (bpm): 155  Movement: Present   No results found for this or any previous visit (from the past 24 hour(s)).  Assessment & Plan:  Low-risk pregnancy of a 24 y.o., G2P1001 at [redacted]w[redacted]d with an Estimated Date of Delivery: 01/07/21   1. Encounter for supervision of low-risk pregnancy in second trimester -Reviewed birth control methods for  postpartum period. -Given pamphlets for PPIUD-Liletta and Paragard -Fundal Height S>D, but already planning for growth [place] d/t obesity on Oct 28th.   2. [redacted] weeks gestation of pregnancy -Doing well -Anticipatory guidance for upcoming appts.  -Next appt in 4 weeks virtually.     Meds: No orders of the defined types were placed in this encounter.  Labs/procedures today:  Lab Orders  No laboratory test(s) ordered today     Reviewed: Preterm labor symptoms and general obstetric precautions including but not limited to vaginal bleeding, contractions, leaking of fluid and fetal movement were reviewed in detail with the patient.  All questions were answered.  Follow-up: Return in about 4 weeks (around 09/28/2020) for Virtual LR-ROB.  No orders of the defined types were placed in this encounter.  Cherre Robins MSN, CNM 08/31/2020

## 2020-08-31 NOTE — Patient Instructions (Signed)

## 2020-09-01 ENCOUNTER — Other Ambulatory Visit: Payer: Self-pay | Admitting: *Deleted

## 2020-09-01 DIAGNOSIS — Z362 Encounter for other antenatal screening follow-up: Secondary | ICD-10-CM

## 2020-09-03 ENCOUNTER — Other Ambulatory Visit: Payer: Self-pay

## 2020-09-03 ENCOUNTER — Ambulatory Visit: Payer: Medicaid Other | Attending: Obstetrics and Gynecology

## 2020-09-03 ENCOUNTER — Other Ambulatory Visit: Payer: Self-pay | Admitting: *Deleted

## 2020-09-03 ENCOUNTER — Ambulatory Visit: Payer: Medicaid Other | Admitting: *Deleted

## 2020-09-03 ENCOUNTER — Encounter: Payer: Self-pay | Admitting: *Deleted

## 2020-09-03 DIAGNOSIS — Z363 Encounter for antenatal screening for malformations: Secondary | ICD-10-CM

## 2020-09-03 DIAGNOSIS — Z362 Encounter for other antenatal screening follow-up: Secondary | ICD-10-CM | POA: Diagnosis not present

## 2020-09-03 DIAGNOSIS — O99210 Obesity complicating pregnancy, unspecified trimester: Secondary | ICD-10-CM

## 2020-09-03 DIAGNOSIS — O99212 Obesity complicating pregnancy, second trimester: Secondary | ICD-10-CM | POA: Diagnosis not present

## 2020-09-03 DIAGNOSIS — Z6841 Body Mass Index (BMI) 40.0 and over, adult: Secondary | ICD-10-CM

## 2020-09-03 DIAGNOSIS — Z3A22 22 weeks gestation of pregnancy: Secondary | ICD-10-CM | POA: Diagnosis not present

## 2020-09-28 ENCOUNTER — Ambulatory Visit (INDEPENDENT_AMBULATORY_CARE_PROVIDER_SITE_OTHER): Payer: Medicaid Other | Admitting: Obstetrics & Gynecology

## 2020-09-28 ENCOUNTER — Other Ambulatory Visit: Payer: Self-pay

## 2020-09-28 VITALS — BP 108/74 | HR 102 | Wt 217.8 lb

## 2020-09-28 DIAGNOSIS — Z3490 Encounter for supervision of normal pregnancy, unspecified, unspecified trimester: Secondary | ICD-10-CM

## 2020-09-28 DIAGNOSIS — Z3492 Encounter for supervision of normal pregnancy, unspecified, second trimester: Secondary | ICD-10-CM

## 2020-09-28 NOTE — Progress Notes (Signed)
  Patient ID: Melissa Thornton, female   DOB: 12/11/1995, 24 y.o.   MRN: [id number]   PRENATAL VISIT NOTE  Subjective:  Melissa Thornton is a 24 y.o. G2P1001 at [redacted]w[redacted]d being seen today for ongoing prenatal care.  She is currently monitored for the following issues for this low-risk pregnancy and has Supervision of low-risk pregnancy and Obesity in pregnancy on their problem list.  Patient reports no complaints.  Contractions: Not present. Vag. Bleeding: None.  Movement: Present. Denies leaking of fluid.   The following portions of the patient's history were reviewed and updated as appropriate: allergies, current medications, past family history, past medical history, past social history, past surgical history and problem list.   Objective:   Vitals:   09/28/20 1340  BP: 108/74  Pulse: (!) 102  Weight: 217 lb 12.8 oz (98.8 kg)    Fetal Status: Fetal Heart Rate (bpm): 152   Movement: Present     General:  Alert, oriented and cooperative. Patient is in no acute distress.  Skin: Skin is warm and dry. No rash noted.   Cardiovascular: Normal heart rate noted  Respiratory: Normal respiratory effort, no problems with respiration noted  Abdomen: Soft, gravid, appropriate for gestational age.  Pain/Pressure: Absent     Pelvic: Cervical exam deferred        Extremities: Normal range of motion.  Edema: None  Mental Status: Normal mood and affect. Normal behavior. Normal judgment and thought content.   Assessment and Plan:  Pregnancy: G2P1001 at [redacted]w[redacted]d 1. Encounter for supervision of low-risk pregnancy in second trimester [place]uc[place] next visit.   Preterm labor symptoms and general obstetric precautions including but not limited to vaginal bleeding, contractions, leaking of fluid and fetal movement were reviewed in detail with the patient. Please refer to After Visit Summary for other counseling recommendations.   Return in 3 weeks (on 10/19/2020).  Future Appointments  Date Time Provider  Department Center  10/21/2020  8:50 AM [facility] LAB [facility]  10/21/2020  9:15 AM Marylene Land, CNM [facility]  10/29/2020  8:30 AM [facility] NURSE [facility]  10/29/2020  8:45 AM [facility] US4 [facility]  12/10/2020  8:30 AM [facility] NURSE [facility]  12/10/2020  8:45 AM [facility] US4 [facility]  12/17/2020  8:30 AM [facility] NURSE [facility]  12/17/2020  8:45 AM [facility] US4 [facility]  12/24/2020  8:30 AM [facility] NURSE [facility]  12/24/2020  8:45 AM [facility] US4 [facility]    Malachy Chamber, MD

## 2020-09-28 NOTE — Patient Instructions (Signed)
Glucose Tolerance Test During Pregnancy Why am I having this test? The glucose tolerance test (GTT) is done to check how your body processes sugar (glucose). This is one of several tests used to diagnose diabetes that develops during pregnancy (gestational diabetes mellitus). Gestational diabetes is a temporary form of diabetes that some women develop during pregnancy. It usually occurs during the second trimester of pregnancy and goes away after delivery. Testing (screening) for gestational diabetes usually occurs between 24 and 28 weeks of pregnancy. You may have the GTT test after having a 1-hour glucose screening test if the results from that test indicate that you may have gestational diabetes. You may also have this test if:  You have a history of gestational diabetes.  You have a history of giving birth to very large babies or have experienced repeated fetal loss (stillbirth).  You have signs and symptoms of diabetes, such as: ? Changes in your vision. ? Tingling or numbness in your hands or feet. ? Changes in hunger, thirst, and urination that are not otherwise explained by your pregnancy. What is being tested? This test measures the amount of glucose in your blood at different times during a period of 3 hours. This indicates how well your body is able to process glucose. What kind of sample is taken?  Blood samples are required for this test. They are usually collected by inserting a needle into a blood vessel. How do I prepare for this test?  For 3 days before your test, eat normally. Have plenty of carbohydrate-rich foods.  Follow instructions from your health care provider about: ? Eating or drinking restrictions on the day of the test. You may be asked to not eat or drink anything other than water (fast) starting 8-10 hours before the test. ? Changing or stopping your regular medicines. Some medicines may interfere with this test. Tell a health care provider about:  All  medicines you are taking, including vitamins, herbs, eye drops, creams, and over-the-counter medicines.  Any blood disorders you have.  Any surgeries you have had.  Any medical conditions you have. What happens during the test? First, your blood glucose will be measured. This is referred to as your fasting blood glucose, since you fasted before the test. Then, you will drink a glucose solution that contains a certain amount of glucose. Your blood glucose will be measured again 1, 2, and 3 hours after drinking the solution. This test takes about 3 hours to complete. You will need to stay at the testing location during this time. During the testing period:  Do not eat or drink anything other than the glucose solution.  Do not exercise.  Do not use any products that contain nicotine or tobacco, such as cigarettes and e-cigarettes. If you need help stopping, ask your health care provider. The testing procedure may vary among health care providers and hospitals. How are the results reported? Your results will be reported as milligrams of glucose per deciliter of blood (mg/dL) or millimoles per liter (mmol/L). Your health care provider will compare your results to normal ranges that were established after testing a large group of people (reference ranges). Reference ranges may vary among labs and hospitals. For this test, common reference ranges are:  Fasting: less than 95-105 mg/dL (5.3-5.8 mmol/L).  1 hour after drinking glucose: less than 180-190 mg/dL (10.0-10.5 mmol/L).  2 hours after drinking glucose: less than 155-165 mg/dL (8.6-9.2 mmol/L).  3 hours after drinking glucose: 140-145 mg/dL (7.8-8.1 mmol/L). What do the   results mean? Results within reference ranges are considered normal, meaning that your glucose levels are well-controlled. If two or more of your blood glucose levels are high, you may be diagnosed with gestational diabetes. If only one level is high, your health care  provider may suggest repeat testing or other tests to confirm a diagnosis. Talk with your health care provider about what your results mean. Questions to ask your health care provider Ask your health care provider, or the department that is doing the test:  When will my results be ready?  How will I get my results?  What are my treatment options?  What other tests do I need?  What are my next steps? Summary  The glucose tolerance test (GTT) is one of several tests used to diagnose diabetes that develops during pregnancy (gestational diabetes mellitus). Gestational diabetes is a temporary form of diabetes that some women develop during pregnancy.  You may have the GTT test after having a 1-hour glucose screening test if the results from that test indicate that you may have gestational diabetes. You may also have this test if you have any symptoms or risk factors for gestational diabetes.  Talk with your health care provider about what your results mean. This information is not intended to replace advice given to you by your health care provider. Make sure you discuss any questions you have with your health care provider. Document Revised: 02/14/2019 Document Reviewed: 06/05/2017 Elsevier Patient Education  2020 Elsevier Inc.  

## 2020-10-20 ENCOUNTER — Other Ambulatory Visit: Payer: Self-pay | Admitting: *Deleted

## 2020-10-20 DIAGNOSIS — Z3490 Encounter for supervision of normal pregnancy, unspecified, unspecified trimester: Secondary | ICD-10-CM

## 2020-10-21 ENCOUNTER — Other Ambulatory Visit: Payer: Self-pay

## 2020-10-21 ENCOUNTER — Ambulatory Visit (INDEPENDENT_AMBULATORY_CARE_PROVIDER_SITE_OTHER): Payer: Medicaid Other | Admitting: Student

## 2020-10-21 ENCOUNTER — Other Ambulatory Visit: Payer: Medicaid Other

## 2020-10-21 ENCOUNTER — Encounter: Payer: Self-pay | Admitting: Student

## 2020-10-21 VITALS — BP 113/80 | HR 101 | Wt 217.5 lb

## 2020-10-21 DIAGNOSIS — Z3493 Encounter for supervision of normal pregnancy, unspecified, third trimester: Secondary | ICD-10-CM | POA: Diagnosis not present

## 2020-10-21 DIAGNOSIS — Z3490 Encounter for supervision of normal pregnancy, unspecified, unspecified trimester: Secondary | ICD-10-CM | POA: Diagnosis not present

## 2020-10-21 DIAGNOSIS — Z23 Encounter for immunization: Secondary | ICD-10-CM | POA: Diagnosis not present

## 2020-10-21 DIAGNOSIS — Z3A28 28 weeks gestation of pregnancy: Secondary | ICD-10-CM

## 2020-10-21 NOTE — Progress Notes (Signed)
   PRENATAL VISIT NOTE  Subjective:  Melissa Thornton is a 24 y.o. G2P1001 at [redacted]w[redacted]d being seen today for ongoing prenatal care.  She is currently monitored for the following issues for this low-risk pregnancy and has Supervision of low-risk pregnancy and Obesity in pregnancy on their problem list.  Patient reports no complaints other than baby feels low.  Contractions: Not present. Vag. Bleeding: None.  Movement: Present. Denies leaking of fluid.   The following portions of the patient's history were reviewed and updated as appropriate: allergies, current medications, past family history, past medical history, past social history, past surgical history and problem list.   Objective:   Vitals:   10/21/20 0933  BP: 113/80  Pulse: (!) 101  Weight: 217 lb 8 oz (98.7 kg)    Fetal Status: Fetal Heart Rate (bpm): 142 Fundal Height: 34 cm Movement: Present     General:  Alert, oriented and cooperative. Patient is in no acute distress.  Skin: Skin is warm and dry. No rash noted.   Cardiovascular: Normal heart rate noted  Respiratory: Normal respiratory effort, no problems with respiration noted  Abdomen: Soft, gravid, appropriate for gestational age.  Pain/Pressure: Present     Pelvic: Cervical exam deferred        Extremities: Normal range of motion.  Edema: None  Mental Status: Normal mood and affect. Normal behavior. Normal judgment and thought content.   Assessment and Plan:  Pregnancy: G2P1001 at [redacted]w[redacted]d 1. Encounter for supervision of low-risk pregnancy in third trimester -Reassured patient of normalcy of baby feeling low while in pregnancy -2 Hour GTT  - Tdap vaccine greater than or equal to 7yo IM  Preterm labor symptoms and general obstetric precautions including but not limited to vaginal bleeding, contractions, leaking of fluid and fetal movement were reviewed in detail with the patient. Please refer to After Visit Summary for other counseling recommendations.   No follow-ups on  file.  Future Appointments  Date Time Provider Department Center  10/29/2020  8:30 AM [facility] NURSE [facility]  10/29/2020  8:45 AM [facility] US4 [facility]  12/10/2020  8:30 AM [facility] NURSE [facility]  12/10/2020  8:45 AM [facility] US4 [facility]  12/17/2020  8:30 AM [facility] NURSE [facility]  12/17/2020  8:45 AM [facility] US4 [facility]  12/24/2020  8:30 AM [facility] NURSE [facility]  12/24/2020  8:45 AM [facility] US4 [facility]    Samara Deist Gena Fray, CNM

## 2020-10-22 ENCOUNTER — Telehealth: Payer: Self-pay | Admitting: *Deleted

## 2020-10-22 ENCOUNTER — Encounter: Payer: Self-pay | Admitting: Student

## 2020-10-22 DIAGNOSIS — O24419 Gestational diabetes mellitus in pregnancy, unspecified control: Secondary | ICD-10-CM | POA: Insufficient documentation

## 2020-10-22 LAB — GLUCOSE TOLERANCE, 2 HOURS W/ 1HR
Glucose, 1 hour: 180 mg/dL — ABNORMAL HIGH (ref 65–179)
Glucose, 2 hour: 149 mg/dL (ref 65–152)
Glucose, Fasting: 95 mg/dL — ABNORMAL HIGH (ref 65–91)

## 2020-10-22 LAB — CBC
Hematocrit: 34.3 % (ref 34.0–46.6)
Hemoglobin: 11.3 g/dL (ref 11.1–15.9)
MCH: 27 pg (ref 26.6–33.0)
MCHC: 32.9 g/dL (ref 31.5–35.7)
MCV: 82 fL (ref 79–97)
Platelets: 316 10*3/uL (ref 150–450)
RBC: 4.18 x10E6/uL (ref 3.77–5.28)
RDW: 12.8 % (ref 11.7–15.4)
WBC: 8.1 10*3/uL (ref 3.4–10.8)

## 2020-10-22 LAB — HIV ANTIBODY (ROUTINE TESTING W REFLEX): HIV Screen 4th Generation wRfx: NONREACTIVE

## 2020-10-22 LAB — RPR: RPR Ser Ql: NONREACTIVE

## 2020-10-22 NOTE — Telephone Encounter (Addendum)
-----   Message from Melissa Thornton, CNM sent at 10/22/2020  9:28 AM EST ----- This patient has gestational diabetes; she needs to be set up with testing, diabetic educator, etc. Thank you! I have let her know via MyChart.  12/16  1320  Referral order for Nutrition DM management placed. She will be contacted via Mychart with appointment information.

## 2020-10-29 ENCOUNTER — Encounter: Payer: Self-pay | Admitting: *Deleted

## 2020-10-29 ENCOUNTER — Other Ambulatory Visit: Payer: Self-pay | Admitting: *Deleted

## 2020-10-29 ENCOUNTER — Other Ambulatory Visit: Payer: Self-pay

## 2020-10-29 ENCOUNTER — Other Ambulatory Visit: Payer: Medicaid Other

## 2020-10-29 ENCOUNTER — Ambulatory Visit: Payer: Medicaid Other | Admitting: *Deleted

## 2020-10-29 ENCOUNTER — Ambulatory Visit: Payer: Medicaid Other | Attending: Obstetrics and Gynecology

## 2020-10-29 ENCOUNTER — Other Ambulatory Visit: Payer: Self-pay | Admitting: Obstetrics

## 2020-10-29 DIAGNOSIS — O99210 Obesity complicating pregnancy, unspecified trimester: Secondary | ICD-10-CM

## 2020-10-29 DIAGNOSIS — O365930 Maternal care for other known or suspected poor fetal growth, third trimester, not applicable or unspecified: Secondary | ICD-10-CM | POA: Diagnosis not present

## 2020-10-29 DIAGNOSIS — O365990 Maternal care for other known or suspected poor fetal growth, unspecified trimester, not applicable or unspecified: Secondary | ICD-10-CM

## 2020-10-29 DIAGNOSIS — Z6841 Body Mass Index (BMI) 40.0 and over, adult: Secondary | ICD-10-CM

## 2020-10-29 DIAGNOSIS — Z3A30 30 weeks gestation of pregnancy: Secondary | ICD-10-CM | POA: Diagnosis not present

## 2020-10-29 DIAGNOSIS — O99213 Obesity complicating pregnancy, third trimester: Secondary | ICD-10-CM

## 2020-10-29 DIAGNOSIS — O24419 Gestational diabetes mellitus in pregnancy, unspecified control: Secondary | ICD-10-CM | POA: Insufficient documentation

## 2020-11-07 NOTE — L&D Delivery Note (Signed)
Delivery Note Called to attend this delivery d/t sudden onset of bleeding and FHR in the 70's.  Upon arrival to the room, pt was noted to be C/C/+2 w/BBOW.  AROM w/bloody fluid.  Advised pt that a Kiwi extractor was necessary d/t prolonged bradycardia. Pt accepted.  Placed on fetal vtx, good descent.  Popped off and Dr. Vergie Living arrived and took over.  Head delivered at 2:52 PM a viable female was delivered via Vaginal, Vacuum (Extractor) (Presentation:   Occiput Posterior).  APGAR:3/8  weight pending.  The cord was doubly clamped and cut, baby transferred to awaiting NICU team.  The placenta delivered immediately after, appears intact w/a 3VC>    Cord pH: 6.99  Anesthesia: Epidural Episiotomy: None Lacerations: none   Suture Repair:  Est. Blood Loss (mL):  300  Mom to postpartum.  Baby to Couplet care / Skin to Skin.  Scarlette Calico Cresenzo-Dishmon 12/24/2020, 3:05 PM

## 2020-11-11 ENCOUNTER — Encounter: Payer: Medicaid Other | Attending: Student | Admitting: Registered"

## 2020-11-11 ENCOUNTER — Ambulatory Visit: Payer: Medicaid Other | Admitting: Registered"

## 2020-11-11 ENCOUNTER — Other Ambulatory Visit: Payer: Self-pay

## 2020-11-11 DIAGNOSIS — Z3A32 32 weeks gestation of pregnancy: Secondary | ICD-10-CM | POA: Diagnosis not present

## 2020-11-11 DIAGNOSIS — O24419 Gestational diabetes mellitus in pregnancy, unspecified control: Secondary | ICD-10-CM | POA: Diagnosis not present

## 2020-11-11 DIAGNOSIS — O24410 Gestational diabetes mellitus in pregnancy, diet controlled: Secondary | ICD-10-CM

## 2020-11-11 MED ORDER — ACCU-CHEK SOFTCLIX LANCETS MISC: 12 refills | Status: DC

## 2020-11-11 MED ORDER — GLUCOSE BLOOD VI STRP: ORAL_STRIP | 12 refills | Status: DC

## 2020-11-11 NOTE — Progress Notes (Signed)
Patient was seen on 11/11/20 for Gestational Diabetes self-management. EDD 01/07/21; [redacted]w[redacted]d Patient states no history of GDM. Diet history obtained. Patient eats variety of all food groups. Beverages include water, ginger ale.  Patient reports 10-15 min 3x/week structured physical activity. Pt states stress level 6-7/10.  The following learning objectives were met by the patient :   States the definition of Gestational Diabetes  States why dietary management is important in controlling blood glucose  Describes the effects of carbohydrates on blood glucose levels  Demonstrates ability to create a balanced meal plan  Demonstrates carbohydrate counting   States when to check blood glucose levels  Demonstrates proper blood glucose monitoring techniques  States the effect of stress and exercise on blood glucose levels  States the importance of limiting caffeine and abstaining from alcohol and smoking  Plan:  Aim for 3 Carbohydrate Choices per meal (45 grams) +/- 1 either way  Aim for 1-2 Carbohydrate Choices per snack Begin reading food labels for Total Carbohydrate of foods If OK with your MD, consider  increasing your activity level by walking, Arm Chair Exercises or other activity daily as tolerated Begin checking Blood Glucose before breakfast and 2 hours after first bite of breakfast, lunch and dinner as directed by MD  Bring Log Book/Sheet and meter to every medical appointment  Baby Scripts: (BS 2.0 not capable of glucose management at this time.) Patient to record blood sugar on glucose log sheet  Take medication if directed by MD  Blood glucose monitor given: Accu-chek Guide Me Lot [id number]: 12/07/2021 CBG: 113 mg/dL (fasting)  Rx order placed for  Accu-chek Guide strips and Softclix lancets  Patient instructed to monitor glucose levels: FBS: 60 - 95 mg/dl 2 hour: <120 mg/dl  Patient received the following handouts:  Nutrition Diabetes and Pregnancy  Carbohydrate  Counting List  Blood glucose Log Sheet  Nutrition in the [place] Patient will be seen for follow-up as needed.

## 2020-11-12 ENCOUNTER — Encounter: Payer: Self-pay | Admitting: *Deleted

## 2020-11-12 ENCOUNTER — Ambulatory Visit: Payer: Medicaid Other | Attending: Obstetrics and Gynecology | Admitting: *Deleted

## 2020-11-12 ENCOUNTER — Ambulatory Visit: Payer: Medicaid Other | Admitting: *Deleted

## 2020-11-12 DIAGNOSIS — O365931 Maternal care for other known or suspected poor fetal growth, third trimester, fetus 1: Secondary | ICD-10-CM | POA: Diagnosis not present

## 2020-11-12 DIAGNOSIS — O24410 Gestational diabetes mellitus in pregnancy, diet controlled: Secondary | ICD-10-CM

## 2020-11-12 DIAGNOSIS — Z3A32 32 weeks gestation of pregnancy: Secondary | ICD-10-CM

## 2020-11-12 DIAGNOSIS — O99210 Obesity complicating pregnancy, unspecified trimester: Secondary | ICD-10-CM

## 2020-11-12 NOTE — Procedures (Signed)
Melissa Thornton Jan 08, 1996 [redacted]w[redacted]d  Fetus A Non-Stress Test Interpretation for 11/12/20  Indication: Diabetes  Fetal Heart Rate A Mode: External Baseline Rate (A): 135 bpm Variability: Moderate Accelerations: 15 x 15 Decelerations: None Multiple birth?: No  Uterine Activity Mode: Palpation,Toco Contraction Frequency (min): none noted Resting Tone Palpated: Relaxed Resting Time: Adequate  Interpretation (Fetal Testing) Nonstress Test Interpretation: Reactive Overall Impression: Reassuring for gestational age Comments: reviewed tracing with Dr. Parke Poisson

## 2020-11-16 ENCOUNTER — Other Ambulatory Visit: Payer: Self-pay

## 2020-11-19 ENCOUNTER — Ambulatory Visit: Payer: Medicaid Other | Attending: Obstetrics and Gynecology

## 2020-11-19 ENCOUNTER — Other Ambulatory Visit: Payer: Self-pay

## 2020-11-19 ENCOUNTER — Ambulatory Visit: Payer: Medicaid Other

## 2020-11-19 ENCOUNTER — Other Ambulatory Visit: Payer: Self-pay | Admitting: Obstetrics and Gynecology

## 2020-11-19 DIAGNOSIS — O99210 Obesity complicating pregnancy, unspecified trimester: Secondary | ICD-10-CM | POA: Insufficient documentation

## 2020-11-19 DIAGNOSIS — Z3A33 33 weeks gestation of pregnancy: Secondary | ICD-10-CM

## 2020-11-19 DIAGNOSIS — O99213 Obesity complicating pregnancy, third trimester: Secondary | ICD-10-CM

## 2020-11-19 DIAGNOSIS — O365990 Maternal care for other known or suspected poor fetal growth, unspecified trimester, not applicable or unspecified: Secondary | ICD-10-CM

## 2020-11-19 DIAGNOSIS — O365930 Maternal care for other known or suspected poor fetal growth, third trimester, not applicable or unspecified: Secondary | ICD-10-CM

## 2020-11-20 ENCOUNTER — Other Ambulatory Visit: Payer: Self-pay | Admitting: *Deleted

## 2020-11-20 DIAGNOSIS — O365990 Maternal care for other known or suspected poor fetal growth, unspecified trimester, not applicable or unspecified: Secondary | ICD-10-CM

## 2020-11-24 ENCOUNTER — Encounter: Payer: Self-pay | Admitting: *Deleted

## 2020-11-24 ENCOUNTER — Ambulatory Visit: Payer: Medicaid Other | Admitting: *Deleted

## 2020-11-24 ENCOUNTER — Other Ambulatory Visit: Payer: Self-pay

## 2020-11-24 ENCOUNTER — Ambulatory Visit: Payer: Medicaid Other

## 2020-11-24 ENCOUNTER — Ambulatory Visit: Payer: Medicaid Other | Attending: Obstetrics and Gynecology | Admitting: *Deleted

## 2020-11-24 DIAGNOSIS — O365930 Maternal care for other known or suspected poor fetal growth, third trimester, not applicable or unspecified: Secondary | ICD-10-CM | POA: Diagnosis not present

## 2020-11-24 DIAGNOSIS — O24410 Gestational diabetes mellitus in pregnancy, diet controlled: Secondary | ICD-10-CM | POA: Diagnosis not present

## 2020-11-24 DIAGNOSIS — Z3A33 33 weeks gestation of pregnancy: Secondary | ICD-10-CM | POA: Insufficient documentation

## 2020-11-24 DIAGNOSIS — O99210 Obesity complicating pregnancy, unspecified trimester: Secondary | ICD-10-CM

## 2020-11-24 NOTE — Procedures (Signed)
Wenda Vanschaick [birth year] [redacted]w[redacted]d  Fetus A Non-Stress Test Interpretation for 11/24/20  Indication: IUGR  Fetal Heart Rate A Mode: External Baseline Rate (A): 145 bpm Variability: Moderate Accelerations: 15 x 15 Decelerations: None Multiple birth?: No  Uterine Activity Mode: Palpation,Toco Contraction Frequency (min): Erratic UI Contraction Quality: Mild Resting Tone Palpated: Relaxed Resting Time: Adequate  Interpretation (Fetal Testing) Nonstress Test Interpretation: Reactive Comments: Dr. Parke Poisson reviewed tracing.

## 2020-12-02 ENCOUNTER — Telehealth (INDEPENDENT_AMBULATORY_CARE_PROVIDER_SITE_OTHER): Payer: Medicaid Other | Admitting: Student

## 2020-12-02 DIAGNOSIS — Z3A34 34 weeks gestation of pregnancy: Secondary | ICD-10-CM

## 2020-12-02 DIAGNOSIS — E669 Obesity, unspecified: Secondary | ICD-10-CM

## 2020-12-02 DIAGNOSIS — R12 Heartburn: Secondary | ICD-10-CM

## 2020-12-02 DIAGNOSIS — O99213 Obesity complicating pregnancy, third trimester: Secondary | ICD-10-CM

## 2020-12-02 DIAGNOSIS — Z3493 Encounter for supervision of normal pregnancy, unspecified, third trimester: Secondary | ICD-10-CM

## 2020-12-02 DIAGNOSIS — O24410 Gestational diabetes mellitus in pregnancy, diet controlled: Secondary | ICD-10-CM

## 2020-12-02 DIAGNOSIS — O99210 Obesity complicating pregnancy, unspecified trimester: Secondary | ICD-10-CM

## 2020-12-02 DIAGNOSIS — O99891 Other specified diseases and conditions complicating pregnancy: Secondary | ICD-10-CM

## 2020-12-02 NOTE — Progress Notes (Signed)
Patient ID: Melissa Thornton, female   DOB: 01-03-1996, 25 y.o.   MRN: [id number]  I connected with Jack Mineau 12/02/20 at  8:15 AM EST by: MyChart video and verified that I am speaking with the correct person using two identifiers.  Patient is located at home and provider is located at [facility].     The purpose of this virtual visit is to provide medical care while limiting exposure to the novel coronavirus. I discussed the limitations, risks, security and privacy concerns of performing an evaluation and management service by MyChart video and the availability of in person appointments. I also discussed with the patient that there may be a patient responsible charge related to this service. By engaging in this virtual visit, you consent to the provision of healthcare.  Additionally, you authorize for your insurance to be billed for the services provided during this visit.  The patient expressed understanding and agreed to proceed.  The following staff members participated in the virtual visit:  Corinda Gubler    PRENATAL VISIT NOTE  Subjective:  Melissa Thornton is a 25 y.o. G2P1001 at [redacted]w[redacted]d  for phone visit for ongoing prenatal care.  She is currently monitored for the following issues for this high-risk pregnancy and has Supervision of low-risk pregnancy; Obesity in pregnancy; and Gestational diabetes on their problem list.  Patient reports heartburn. She reports that her fastings are between 80-90. She reports 2 hours post prandial below 120, except for 1 time. Feels like she is doing well with her sugars.  Contractions: Not present. Vag. Bleeding: None.  Movement: Present. Denies leaking of fluid.   The following portions of the patient's history were reviewed and updated as appropriate: allergies, current medications, past family history, past medical history, past social history, past surgical history and problem list.   Objective:  There were no vitals filed for this visit. Self-Obtained  Fetal  Status:     Movement: Present     Assessment and Plan:  Pregnancy: G2P1001 at [redacted]w[redacted]d 1. Encounter for supervision of low-risk pregnancy in third trimester -recommend avoiding eating and drinking two hours before going to bed  2. Obesity in pregnancy   3. Gestational diabetes -continuing to do well with her BS control -continue weekly testing (already scheduled) - Preterm labor symptoms and general obstetric precautions including but not limited to vaginal bleeding, contractions, leaking of fluid and fetal movement were reviewed in detail with the patient.  No follow-ups on file.  Future Appointments  Date Time Provider Department Center  12/03/2020  9:00 AM [facility] NURSE [facility]  12/03/2020  9:15 AM [facility] US2 [facility]  12/10/2020  8:30 AM [facility]SE [facility]  12/10/2020  8:45 AM [facility] US4 [facility]  12/17/2020  8:30 AM [facility]URSE [facility]  12/17/2020  8:45 AM [facility] US4 [facility]  12/24/2020  8:30 AM [facility] NURSE [facility]  12/24/2020  8:45 AM [facility] US4 [facility]     Time spent on virtual visit: 15 minutes  Marylene Land, CNM

## 2020-12-02 NOTE — Progress Notes (Signed)
Pt states does not have access to BP Cuff at this time , will take later & record in BRx.

## 2020-12-03 ENCOUNTER — Other Ambulatory Visit: Payer: Self-pay

## 2020-12-03 ENCOUNTER — Encounter: Payer: Self-pay | Admitting: *Deleted

## 2020-12-03 ENCOUNTER — Ambulatory Visit: Payer: Medicaid Other | Attending: Obstetrics and Gynecology

## 2020-12-03 ENCOUNTER — Ambulatory Visit: Payer: Medicaid Other | Admitting: *Deleted

## 2020-12-03 ENCOUNTER — Other Ambulatory Visit: Payer: Self-pay | Admitting: *Deleted

## 2020-12-03 DIAGNOSIS — O24410 Gestational diabetes mellitus in pregnancy, diet controlled: Secondary | ICD-10-CM | POA: Diagnosis not present

## 2020-12-03 DIAGNOSIS — E669 Obesity, unspecified: Secondary | ICD-10-CM

## 2020-12-03 DIAGNOSIS — O365990 Maternal care for other known or suspected poor fetal growth, unspecified trimester, not applicable or unspecified: Secondary | ICD-10-CM | POA: Diagnosis not present

## 2020-12-03 DIAGNOSIS — O365930 Maternal care for other known or suspected poor fetal growth, third trimester, not applicable or unspecified: Secondary | ICD-10-CM | POA: Diagnosis not present

## 2020-12-03 DIAGNOSIS — O99213 Obesity complicating pregnancy, third trimester: Secondary | ICD-10-CM | POA: Diagnosis not present

## 2020-12-03 DIAGNOSIS — Z3A35 35 weeks gestation of pregnancy: Secondary | ICD-10-CM

## 2020-12-03 DIAGNOSIS — O99210 Obesity complicating pregnancy, unspecified trimester: Secondary | ICD-10-CM

## 2020-12-10 ENCOUNTER — Ambulatory Visit (INDEPENDENT_AMBULATORY_CARE_PROVIDER_SITE_OTHER): Payer: Medicaid Other | Admitting: Obstetrics and Gynecology

## 2020-12-10 ENCOUNTER — Encounter: Payer: Self-pay | Admitting: Obstetrics and Gynecology

## 2020-12-10 ENCOUNTER — Other Ambulatory Visit (HOSPITAL_COMMUNITY)
Admission: RE | Admit: 2020-12-10 | Discharge: 2020-12-10 | Disposition: A | Discharge status: Home / Self Care | Payer: Medicaid Other | Source: Ambulatory Visit | Attending: Obstetrics & Gynecology | Admitting: Obstetrics & Gynecology

## 2020-12-10 ENCOUNTER — Encounter: Payer: Self-pay | Admitting: *Deleted

## 2020-12-10 ENCOUNTER — Other Ambulatory Visit: Payer: Self-pay

## 2020-12-10 ENCOUNTER — Other Ambulatory Visit (HOSPITAL_COMMUNITY): Payer: Self-pay | Admitting: Obstetrics

## 2020-12-10 ENCOUNTER — Ambulatory Visit: Payer: Medicaid Other | Attending: Obstetrics and Gynecology

## 2020-12-10 ENCOUNTER — Ambulatory Visit: Payer: Medicaid Other | Admitting: *Deleted

## 2020-12-10 VITALS — BP 141/95 | HR 91 | Wt 216.9 lb

## 2020-12-10 DIAGNOSIS — O0990 Supervision of high risk pregnancy, unspecified, unspecified trimester: Secondary | ICD-10-CM

## 2020-12-10 DIAGNOSIS — O99210 Obesity complicating pregnancy, unspecified trimester: Secondary | ICD-10-CM | POA: Insufficient documentation

## 2020-12-10 DIAGNOSIS — O24410 Gestational diabetes mellitus in pregnancy, diet controlled: Secondary | ICD-10-CM

## 2020-12-10 DIAGNOSIS — O365930 Maternal care for other known or suspected poor fetal growth, third trimester, not applicable or unspecified: Secondary | ICD-10-CM | POA: Diagnosis not present

## 2020-12-10 DIAGNOSIS — O99213 Obesity complicating pregnancy, third trimester: Secondary | ICD-10-CM

## 2020-12-10 DIAGNOSIS — Z3A36 36 weeks gestation of pregnancy: Secondary | ICD-10-CM

## 2020-12-10 DIAGNOSIS — O365990 Maternal care for other known or suspected poor fetal growth, unspecified trimester, not applicable or unspecified: Secondary | ICD-10-CM | POA: Insufficient documentation

## 2020-12-10 DIAGNOSIS — Z6841 Body Mass Index (BMI) 40.0 and over, adult: Secondary | ICD-10-CM | POA: Insufficient documentation

## 2020-12-10 NOTE — Patient Instructions (Signed)

## 2020-12-10 NOTE — Progress Notes (Signed)
Subjective:  Melissa Thornton is a 25 y.o. G2P1001 at [redacted]w[redacted]d being seen today for ongoing prenatal care.  She is currently monitored for the following issues for this high-risk pregnancy and has Supervision of high risk pregnancy, antepartum; Obesity in pregnancy; and Gestational diabetes on their problem list.  Patient reports general discomforts of pregnancy.  Contractions: Irritability. Vag. Bleeding: None.  Movement: Present. Denies leaking of fluid.   The following portions of the patient's history were reviewed and updated as appropriate: allergies, current medications, past family history, past medical history, past social history, past surgical history and problem list. Problem list updated.  Objective:   Vitals:   12/10/20 1357 12/10/20 1400  BP: (!) 128/91 (!) 141/95  Pulse: (!) 106 91  Weight: 216 lb 14.4 oz (98.4 kg)     Fetal Status: Fetal Heart Rate (bpm): 144   Movement: Present     General:  Alert, oriented and cooperative. Patient is in no acute distress.  Skin: Skin is warm and dry. No rash noted.   Cardiovascular: Normal heart rate noted  Respiratory: Normal respiratory effort, no problems with respiration noted  Abdomen: Soft, gravid, appropriate for gestational age. Pain/Pressure: Present     Pelvic:  Cervical exam performed        Extremities: Normal range of motion.  Edema: None  Mental Status: Normal mood and affect. Normal behavior. Normal judgment and thought content.   Urinalysis:      Assessment and Plan:  Pregnancy: G2P1001 at [redacted]w[redacted]d  1. Supervision of high risk pregnancy, antepartum Labor precautions - GC/Chlamydia probe amp ([place])not at [facility] - Culture, beta strep (group b only)  2. Diet controlled gestational diabetes mellitus (GDM) in third trimester Did not bring CBG's but reports in goal range Growth scan today Antenatal testing weekly  Preterm labor symptoms and general obstetric precautions including but not limited to vaginal bleeding,  contractions, leaking of fluid and fetal movement were reviewed in detail with the patient. Please refer to After Visit Summary for other counseling recommendations.  Return in about 1 week (around 12/17/2020) for OB visit, face to face, MD only.   Hermina Staggers, MD

## 2020-12-10 NOTE — Progress Notes (Signed)
Pt did not bring Glucose paper log.

## 2020-12-11 LAB — GC/CHLAMYDIA PROBE AMP (~~LOC~~) NOT AT ARMC
Chlamydia: NEGATIVE
Comment: NEGATIVE
Comment: NORMAL
Neisseria Gonorrhea: NEGATIVE

## 2020-12-15 ENCOUNTER — Telehealth: Payer: Self-pay

## 2020-12-15 LAB — CULTURE, BETA STREP (GROUP B ONLY): Strep Gp B Culture: POSITIVE — AB

## 2020-12-15 NOTE — Telephone Encounter (Signed)
Called patient to reschedule from 12/24/20, due to staff meeting that morning.  Requested that patient call our office back to reschedule.

## 2020-12-16 ENCOUNTER — Encounter: Payer: Self-pay | Admitting: Obstetrics and Gynecology

## 2020-12-16 DIAGNOSIS — O99820 Streptococcus B carrier state complicating pregnancy: Secondary | ICD-10-CM | POA: Insufficient documentation

## 2020-12-17 ENCOUNTER — Ambulatory Visit: Payer: Medicaid Other | Admitting: *Deleted

## 2020-12-17 ENCOUNTER — Encounter: Payer: Self-pay | Admitting: *Deleted

## 2020-12-17 ENCOUNTER — Telehealth: Payer: Self-pay | Admitting: Obstetrics & Gynecology

## 2020-12-17 ENCOUNTER — Other Ambulatory Visit: Payer: Self-pay

## 2020-12-17 ENCOUNTER — Ambulatory Visit: Payer: Medicaid Other | Attending: Obstetrics and Gynecology

## 2020-12-17 ENCOUNTER — Other Ambulatory Visit: Payer: Self-pay | Admitting: Obstetrics & Gynecology

## 2020-12-17 ENCOUNTER — Encounter: Payer: Medicaid Other | Admitting: Obstetrics & Gynecology

## 2020-12-17 DIAGNOSIS — O24419 Gestational diabetes mellitus in pregnancy, unspecified control: Secondary | ICD-10-CM | POA: Diagnosis not present

## 2020-12-17 DIAGNOSIS — O99210 Obesity complicating pregnancy, unspecified trimester: Secondary | ICD-10-CM

## 2020-12-17 DIAGNOSIS — O99213 Obesity complicating pregnancy, third trimester: Secondary | ICD-10-CM | POA: Insufficient documentation

## 2020-12-17 DIAGNOSIS — O365930 Maternal care for other known or suspected poor fetal growth, third trimester, not applicable or unspecified: Secondary | ICD-10-CM

## 2020-12-17 DIAGNOSIS — O365990 Maternal care for other known or suspected poor fetal growth, unspecified trimester, not applicable or unspecified: Secondary | ICD-10-CM

## 2020-12-17 DIAGNOSIS — O0990 Supervision of high risk pregnancy, unspecified, unspecified trimester: Secondary | ICD-10-CM

## 2020-12-17 DIAGNOSIS — Z3A37 37 weeks gestation of pregnancy: Secondary | ICD-10-CM | POA: Diagnosis not present

## 2020-12-17 DIAGNOSIS — O24410 Gestational diabetes mellitus in pregnancy, diet controlled: Secondary | ICD-10-CM | POA: Diagnosis not present

## 2020-12-17 DIAGNOSIS — Z6841 Body Mass Index (BMI) 40.0 and over, adult: Secondary | ICD-10-CM

## 2020-12-17 NOTE — Telephone Encounter (Signed)
Pt states that she had ultrasound today and was told that she needs appt this week due to induction coming up but no induction date. Pt request a call back today concerning this matter due to ultrasound stating urgent matter to be seen this week before induction per pt. thanks

## 2020-12-17 NOTE — Progress Notes (Signed)
IOL at 38 weeks per Dr. Judeth Cornfield

## 2020-12-17 NOTE — Telephone Encounter (Signed)
I called pt and she did not answer. I left a message stating that I would send a Mychart message to address her concerns.

## 2020-12-20 ENCOUNTER — Inpatient Hospital Stay (HOSPITAL_COMMUNITY)
Admission: AD | Admit: 2020-12-20 | Discharge: 2020-12-20 | Disposition: A | Discharge status: Home / Self Care | Payer: Medicaid Other | Attending: Obstetrics and Gynecology | Admitting: Obstetrics and Gynecology

## 2020-12-20 ENCOUNTER — Other Ambulatory Visit: Payer: Self-pay

## 2020-12-20 ENCOUNTER — Encounter (HOSPITAL_COMMUNITY): Payer: Self-pay | Admitting: Obstetrics and Gynecology

## 2020-12-20 DIAGNOSIS — R030 Elevated blood-pressure reading, without diagnosis of hypertension: Secondary | ICD-10-CM | POA: Diagnosis not present

## 2020-12-20 DIAGNOSIS — Z79899 Other long term (current) drug therapy: Secondary | ICD-10-CM | POA: Insufficient documentation

## 2020-12-20 DIAGNOSIS — O0990 Supervision of high risk pregnancy, unspecified, unspecified trimester: Secondary | ICD-10-CM

## 2020-12-20 DIAGNOSIS — O99210 Obesity complicating pregnancy, unspecified trimester: Secondary | ICD-10-CM

## 2020-12-20 DIAGNOSIS — R945 Abnormal results of liver function studies: Secondary | ICD-10-CM

## 2020-12-20 DIAGNOSIS — M545 Low back pain, unspecified: Secondary | ICD-10-CM | POA: Insufficient documentation

## 2020-12-20 DIAGNOSIS — O99891 Other specified diseases and conditions complicating pregnancy: Secondary | ICD-10-CM

## 2020-12-20 DIAGNOSIS — Z20822 Contact with and (suspected) exposure to covid-19: Secondary | ICD-10-CM | POA: Diagnosis not present

## 2020-12-20 DIAGNOSIS — O479 False labor, unspecified: Secondary | ICD-10-CM

## 2020-12-20 DIAGNOSIS — Z3A37 37 weeks gestation of pregnancy: Secondary | ICD-10-CM | POA: Insufficient documentation

## 2020-12-20 DIAGNOSIS — O471 False labor at or after 37 completed weeks of gestation: Secondary | ICD-10-CM | POA: Diagnosis not present

## 2020-12-20 DIAGNOSIS — R7989 Other specified abnormal findings of blood chemistry: Secondary | ICD-10-CM

## 2020-12-20 DIAGNOSIS — O26893 Other specified pregnancy related conditions, third trimester: Secondary | ICD-10-CM | POA: Diagnosis not present

## 2020-12-20 HISTORY — DX: Gestational diabetes mellitus in pregnancy, unspecified control: O24.419

## 2020-12-20 LAB — COMPREHENSIVE METABOLIC PANEL
ALT: 66 U/L — ABNORMAL HIGH (ref 0–44)
AST: 80 U/L — ABNORMAL HIGH (ref 15–41)
Albumin: 3 g/dL — ABNORMAL LOW (ref 3.5–5.0)
Alkaline Phosphatase: 179 U/L — ABNORMAL HIGH (ref 38–126)
Anion gap: 11 (ref 5–15)
BUN: 5 mg/dL — ABNORMAL LOW (ref 6–20)
CO2: 22 mmol/L (ref 22–32)
Calcium: 9.2 mg/dL (ref 8.9–10.3)
Chloride: 105 mmol/L (ref 98–111)
Creatinine, Ser: 0.6 mg/dL (ref 0.44–1.00)
GFR, Estimated: >60 mL/min (ref >60)
Glucose, Bld: 88 mg/dL (ref 70–99)
Potassium: 3.6 mmol/L (ref 3.5–5.1)
Sodium: 138 mmol/L (ref 135–145)
Total Bilirubin: 0.8 mg/dL (ref 0.3–1.2)
Total Protein: 6.8 g/dL (ref 6.5–8.1)

## 2020-12-20 LAB — PROTEIN / CREATININE RATIO, URINE
Creatinine, Urine: 282.91 mg/dL
Protein Creatinine Ratio: 0.21 mg/mg{Cre} — ABNORMAL HIGH (ref 0.00–0.15)
Total Protein, Urine: 58 mg/dL

## 2020-12-20 LAB — CBC
HCT: 36.3 % (ref 36.0–46.0)
Hemoglobin: 11.6 g/dL — ABNORMAL LOW (ref 12.0–15.0)
MCH: 26.1 pg (ref 26.0–34.0)
MCHC: 32 g/dL (ref 30.0–36.0)
MCV: 81.6 fL (ref 80.0–100.0)
Platelets: 353 10*3/uL (ref 150–400)
RBC: 4.45 MIL/uL (ref 3.87–5.11)
RDW: 14.2 % (ref 11.5–15.5)
WBC: 7.1 10*3/uL (ref 4.0–10.5)
nRBC: 0 % (ref 0.0–0.2)

## 2020-12-20 LAB — HEPATITIS PANEL, ACUTE
HCV Ab: NONREACTIVE
Hep A IgM: NONREACTIVE
Hep B C IgM: NONREACTIVE
Hepatitis B Surface Ag: NONREACTIVE

## 2020-12-20 LAB — SARS CORONAVIRUS 2 (TAT 6-24 HRS): SARS Coronavirus 2: NEGATIVE

## 2020-12-20 NOTE — MAU Provider Note (Signed)
History  Chief Complaint  Patient presents with  . Back Pain   HPI  Melissa Thornton is a 25 year old G2P1001 at 73w3dwho presented to MAU for concern of contractions in addition to lower back pain and abdominal cramping that started at approximately 2000. Pt denies vaginal bleeding, leakage of fluid. Pt endorses fetal movement.  Past Medical History:  Diagnosis Date  . Gestational diabetes   . Medical history non-contributory     Past Surgical History:  Procedure Laterality Date  . NO PAST SURGERIES      Family History  Problem Relation Age of Onset  . Cancer Neg Hx   . Diabetes Neg Hx   . Hypertension Neg Hx   . Miscarriages / Stillbirths Neg Hx   . Birth defects Neg Hx     Social History   Tobacco Use  . Smoking status: Never Smoker  . Smokeless tobacco: Never Used  Vaping Use  . Vaping Use: Never used  Substance Use Topics  . Alcohol use: Not Currently    Comment: occasionally  . Drug use: No    Allergies: No Known Allergies  Medications Prior to Admission  Medication Sig Dispense Refill Last Dose  . Accu-Chek Softclix Lancets lancets Use as instructed 100 each 12 12/19/2020 at Unknown time  . Blood Pressure Monitoring (BLOOD PRESSURE KIT) DEVI 1 Device by Does not apply route as needed. 1 each 0 12/19/2020 at Unknown time  . glucose blood test strip Use as instructed 100 each 12 12/19/2020 at Unknown time  . Prenatal Vit-Fe Fumarate-FA (PRENATAL VITAMINS PO) Take 1 tablet by mouth daily.   12/19/2020 at Unknown time    Review of Systems  Constitutional: Negative for chills and fever.  HENT: Negative for congestion and sore throat.   Eyes: Negative for photophobia.  Respiratory: Negative for cough and shortness of breath.   Cardiovascular: Negative for chest pain.  Gastrointestinal: Positive for abdominal pain. Negative for constipation, diarrhea, nausea and vomiting.  Genitourinary: Negative for dysuria, vaginal bleeding, vaginal discharge and vaginal pain.   Musculoskeletal: Positive for back pain.  Neurological: Negative for dizziness, light-headedness and headaches.    Physical Exam Blood pressure 129/80, pulse 94, temperature 98.1 F (36.7 C), temperature source Oral, resp. rate 18, height 4' 11"  (1.499 m), weight 96.2 kg, last menstrual period 03/27/2020, SpO2 97 %. Physical Exam Constitutional:      Appearance: Normal appearance. She is normal weight.  HENT:     Head: Normocephalic and atraumatic.     Mouth/Throat:     Mouth: Mucous membranes are moist.  Eyes:     Extraocular Movements: Extraocular movements intact.  Cardiovascular:     Rate and Rhythm: Normal rate.  Pulmonary:     Effort: Pulmonary effort is normal.  Abdominal:     General: There is no distension.     Palpations: Abdomen is soft.     Tenderness: There is no abdominal tenderness.  Musculoskeletal:        General: Normal range of motion.     Cervical back: Normal range of motion and neck supple.  Neurological:     General: No focal deficit present.     Mental Status: She is alert and oriented to person, place, and time.  Psychiatric:        Mood and Affect: Mood normal.        Behavior: Behavior normal.    MAU Course: Given elevated LFTs without known baseline, Hepatitis panel and COVID swab were obtained prior to  discharge.  Assessment & Plan: Melissa Thornton is a 25 year old G2P1001 at 26w3dwho presented to MAU for labor check. No signs/symptoms of active labor s/p 2 hours of observation and repeat cervical exam. On arrival she was found to have 1 elevated mild range blood pressure that immediately normalized. No concerning signs/symptoms of preeclampsia. Preeclampsia labs were obtained and notable for elevated LFTs. Given elevated LFTs, hepatitis panel and COVID swab were obtained prior to discharge. -provided strict return precautions for preeclampsia signs/symptoms & any elevated blood pressures (confirmed pt has blood pressure cuff at home) -will call pt  with results of covid and hepatitis panel -plan for close follow-up with Dr. ARoselie Awkwardon 12/24/20  GRanda Ngo MD OB Fellow, Faculty [facility] 12/20/2020 8:23 AM

## 2020-12-20 NOTE — MAU Note (Signed)
Pt reports severe lower back pain and abd cramping that started around 8pm. Denies any vag bleeding or discharge or leaking. Good fetal movement reported.

## 2020-12-20 NOTE — Discharge Instructions (Signed)
Please be sure to keep your appointment with Dr. Debroah Loop for Feb 17th. Please call sooner if concern of severe headache, vision changes, severe abdominal pain, or elevated blood pressures greater than 140/90. Given your elevated liver tests today, we collected a hepatitis panel and also tested you for covid. We will contact you if any of these results come back abnormal.  Braxton Hicks Contractions Contractions of the uterus can occur throughout pregnancy, but they are not always a sign that you are in labor. You may have practice contractions called Braxton Hicks contractions. These false labor contractions are sometimes confused with true labor. What are Deberah Pelton contractions? Braxton Hicks contractions are tightening movements that occur in the muscles of the uterus before labor. Unlike true labor contractions, these contractions do not result in opening (dilation) and thinning of the cervix. Toward the end of pregnancy (32-34 weeks), Braxton Hicks contractions can happen more often and may become stronger. These contractions are sometimes difficult to tell apart from true labor because they can be very uncomfortable. You should not feel embarrassed if you go to the hospital with false labor. Sometimes, the only way to tell if you are in true labor is for your health care provider to look for changes in the cervix. The health care provider will do a physical exam and may monitor your contractions. If you are not in true labor, the exam should show that your cervix is not dilating and your water has not broken. If there are no other health problems associated with your pregnancy, it is completely safe for you to be sent home with false labor. You may continue to have Braxton Hicks contractions until you go into true labor. How to tell the difference between true labor and false labor True labor  Contractions last 30-70 seconds.  Contractions become very regular.  Discomfort is usually felt in  the top of the uterus, and it spreads to the lower abdomen and low back.  Contractions do not go away with walking.  Contractions usually become more intense and increase in frequency.  The cervix dilates and gets thinner. False labor  Contractions are usually shorter and not as strong as true labor contractions.  Contractions are usually irregular.  Contractions are often felt in the front of the lower abdomen and in the groin.  Contractions may go away when you walk around or change positions while lying down.  Contractions get weaker and are shorter-lasting as time goes on.  The cervix usually does not dilate or become thin. Follow these instructions at home:  Take over-the-counter and prescription medicines only as told by your health care provider.  Keep up with your usual exercises and follow other instructions from your health care provider.  Eat and drink lightly if you think you are going into labor.  If Braxton Hicks contractions are making you uncomfortable: ? Change your position from lying down or resting to walking, or change from walking to resting. ? Sit and rest in a tub of warm water. ? Drink enough fluid to keep your urine pale yellow. Dehydration may cause these contractions. ? Do slow and deep breathing several times an hour.  Keep all follow-up prenatal visits as told by your health care provider. This is important.   Contact a health care provider if:  You have a fever.  You have continuous pain in your abdomen. Get help right away if:  Your contractions become stronger, more regular, and closer together.  You have fluid leaking  or gushing from your vagina.  You pass blood-tinged mucus (bloody show).  You have bleeding from your vagina.  You have low back pain that you never had before.  You feel your baby's head pushing down and causing pelvic pressure.  Your baby is not moving inside you as much as it used to. Summary  Contractions  that occur before labor are called Braxton Hicks contractions, false labor, or practice contractions.  Braxton Hicks contractions are usually shorter, weaker, farther apart, and less regular than true labor contractions. True labor contractions usually become progressively stronger and regular, and they become more frequent.  Manage discomfort from [facility] contractions by changing position, resting in a warm bath, drinking plenty of water, or practicing deep breathing. This information is not intended to replace advice given to you by your health care provider. Make sure you discuss any questions you have with your health care provider. Document Revised: 10/06/2017 Document Reviewed: 03/09/2017 Elsevier Patient Education  2021 [employer]vier Inc.   Fetal Movement Counts Patient Name: ________________________________________________ Patient Due Date: ____________________  What is a fetal movement count? A fetal movement count is the number of times that you feel your baby move during a certain amount of time. This may also be called a fetal kick count. A fetal movement count is recommended for every pregnant woman. You may be asked to start counting fetal movements as early as week 28 of your pregnancy. Pay attention to when your baby is most active. You may notice your baby's sleep and wake cycles. You may also notice things that make your baby move more. You should do a fetal movement count:  When your baby is normally most active.  At the same time each day. A good time to count movements is while you are resting, after having something to eat and drink. How do I count fetal movements? 1. Find a quiet, comfortable area. Sit, or lie down on your side. 2. Write down the date, the start time and stop time, and the number of movements that you felt between those two times. Take this information with you to your health care visits. 3. Write down your start time when you feel the first  movement. 4. Count kicks, flutters, swishes, rolls, and jabs. You should feel at least 10 movements. 5. You may stop counting after you have felt 10 movements, or if you have been counting for 2 hours. Write down the stop time. 6. If you do not feel 10 movements in 2 hours, contact your health care provider for further instructions. Your health care provider may want to do additional tests to assess your baby's well-being. Contact a health care provider if:  You feel fewer than 10 movements in 2 hours.  Your baby is not moving like he or she usually does. Date: ____________ Start time: ____________ Stop time: ____________ Movements: ____________ Date: ____________ Start time: ____________ Stop time: ____________ Movements: ____________ Date: ____________ Start time: ____________ Stop time: ____________ Movements: ____________ Date: ____________ Start time: ____________ Stop time: ____________ Movements: ____________ Date: ____________ Start time: ____________ Stop time: ____________ Movements: ____________ Date: ____________ Start time: ____________ Stop time: ____________ Movements: ____________ Date: ____________ Start time: ____________ Stop time: ____________ Movements: ____________ Date: ____________ Start time: ____________ Stop time: ____________ Movements: ____________ Date: ____________ Start time: ____________ Stop time: ____________ Movements: ____________ This information is not intended to replace advice given to you by your health care provider. Make sure you discuss any questions you have with your health care  provider. Document Revised: 06/13/2019 Document Reviewed: 06/13/2019 Elsevier Patient Education  2021 [employer].

## 2020-12-22 ENCOUNTER — Telehealth (HOSPITAL_COMMUNITY): Payer: Self-pay | Admitting: *Deleted

## 2020-12-22 ENCOUNTER — Other Ambulatory Visit: Payer: Self-pay | Admitting: Advanced Practice Midwife

## 2020-12-22 NOTE — Telephone Encounter (Signed)
Preadmission screen  

## 2020-12-23 ENCOUNTER — Telehealth (HOSPITAL_COMMUNITY): Payer: Self-pay | Admitting: *Deleted

## 2020-12-23 ENCOUNTER — Other Ambulatory Visit: Payer: Self-pay | Admitting: Advanced Practice Midwife

## 2020-12-23 ENCOUNTER — Encounter (HOSPITAL_COMMUNITY): Payer: Self-pay | Admitting: *Deleted

## 2020-12-23 NOTE — Telephone Encounter (Signed)
Preadmission screen  

## 2020-12-24 ENCOUNTER — Inpatient Hospital Stay (HOSPITAL_COMMUNITY)
Admission: AD | Admit: 2020-12-24 | Discharge: 2020-12-26 | DRG: 805 | Disposition: A | Discharge status: Home / Self Care | Payer: Medicaid Other | Attending: Obstetrics and Gynecology | Admitting: Obstetrics and Gynecology

## 2020-12-24 ENCOUNTER — Ambulatory Visit: Payer: Medicaid Other

## 2020-12-24 ENCOUNTER — Encounter: Payer: Medicaid Other | Admitting: Obstetrics & Gynecology

## 2020-12-24 ENCOUNTER — Inpatient Hospital Stay (HOSPITAL_COMMUNITY): Payer: Medicaid Other | Admitting: Anesthesiology

## 2020-12-24 ENCOUNTER — Inpatient Hospital Stay (HOSPITAL_COMMUNITY): Payer: Medicaid Other

## 2020-12-24 ENCOUNTER — Encounter (HOSPITAL_COMMUNITY): Payer: Self-pay | Admitting: Obstetrics & Gynecology

## 2020-12-24 ENCOUNTER — Other Ambulatory Visit: Payer: Self-pay

## 2020-12-24 ENCOUNTER — Other Ambulatory Visit: Payer: Medicaid Other

## 2020-12-24 DIAGNOSIS — O24419 Gestational diabetes mellitus in pregnancy, unspecified control: Secondary | ICD-10-CM | POA: Diagnosis present

## 2020-12-24 DIAGNOSIS — O365930 Maternal care for other known or suspected poor fetal growth, third trimester, not applicable or unspecified: Principal | ICD-10-CM | POA: Diagnosis present

## 2020-12-24 DIAGNOSIS — Z3A38 38 weeks gestation of pregnancy: Secondary | ICD-10-CM | POA: Diagnosis not present

## 2020-12-24 DIAGNOSIS — O99824 Streptococcus B carrier state complicating childbirth: Secondary | ICD-10-CM | POA: Diagnosis present

## 2020-12-24 DIAGNOSIS — O24420 Gestational diabetes mellitus in childbirth, diet controlled: Secondary | ICD-10-CM | POA: Diagnosis present

## 2020-12-24 DIAGNOSIS — O99210 Obesity complicating pregnancy, unspecified trimester: Secondary | ICD-10-CM

## 2020-12-24 DIAGNOSIS — O365990 Maternal care for other known or suspected poor fetal growth, unspecified trimester, not applicable or unspecified: Secondary | ICD-10-CM | POA: Diagnosis present

## 2020-12-24 DIAGNOSIS — O4593 Premature separation of placenta, unspecified, third trimester: Secondary | ICD-10-CM | POA: Diagnosis not present

## 2020-12-24 DIAGNOSIS — O0990 Supervision of high risk pregnancy, unspecified, unspecified trimester: Secondary | ICD-10-CM

## 2020-12-24 DIAGNOSIS — O24429 Gestational diabetes mellitus in childbirth, unspecified control: Secondary | ICD-10-CM | POA: Diagnosis not present

## 2020-12-24 DIAGNOSIS — O99214 Obesity complicating childbirth: Secondary | ICD-10-CM | POA: Diagnosis not present

## 2020-12-24 DIAGNOSIS — O9902 Anemia complicating childbirth: Secondary | ICD-10-CM | POA: Diagnosis not present

## 2020-12-24 DIAGNOSIS — D649 Anemia, unspecified: Secondary | ICD-10-CM | POA: Diagnosis not present

## 2020-12-24 DIAGNOSIS — O99820 Streptococcus B carrier state complicating pregnancy: Secondary | ICD-10-CM

## 2020-12-24 DIAGNOSIS — O43893 Other placental disorders, third trimester: Secondary | ICD-10-CM | POA: Diagnosis not present

## 2020-12-24 LAB — COMPREHENSIVE METABOLIC PANEL
ALT: 52 U/L — ABNORMAL HIGH (ref 0–44)
AST: 93 U/L — ABNORMAL HIGH (ref 15–41)
Albumin: 2.9 g/dL — ABNORMAL LOW (ref 3.5–5.0)
Alkaline Phosphatase: 177 U/L — ABNORMAL HIGH (ref 38–126)
Anion gap: 12 (ref 5–15)
BUN: 6 mg/dL (ref 6–20)
CO2: 18 mmol/L — ABNORMAL LOW (ref 22–32)
Calcium: 9 mg/dL (ref 8.9–10.3)
Chloride: 104 mmol/L (ref 98–111)
Creatinine, Ser: 0.47 mg/dL (ref 0.44–1.00)
GFR, Estimated: >60 mL/min (ref >60)
Glucose, Bld: 73 mg/dL (ref 70–99)
Potassium: 3.7 mmol/L (ref 3.5–5.1)
Sodium: 134 mmol/L — ABNORMAL LOW (ref 135–145)
Total Bilirubin: 0.4 mg/dL (ref 0.3–1.2)
Total Protein: 6.6 g/dL (ref 6.5–8.1)

## 2020-12-24 LAB — GLUCOSE, CAPILLARY
Glucose-Capillary: 85 mg/dL (ref 70–99)
Glucose-Capillary: 71 mg/dL (ref 70–99)
Glucose-Capillary: 160 mg/dL — ABNORMAL HIGH (ref 70–99)

## 2020-12-24 LAB — PROTEIN / CREATININE RATIO, URINE
Creatinine, Urine: 86.24 mg/dL
Protein Creatinine Ratio: 0.14 mg/mg{Cre} (ref 0.00–0.15)
Total Protein, Urine: 12 mg/dL

## 2020-12-24 LAB — CBC
HCT: 35.3 % — ABNORMAL LOW (ref 36.0–46.0)
Hemoglobin: 11.1 g/dL — ABNORMAL LOW (ref 12.0–15.0)
MCH: 26 pg (ref 26.0–34.0)
MCHC: 31.4 g/dL (ref 30.0–36.0)
MCV: 82.7 fL (ref 80.0–100.0)
Platelets: 387 10*3/uL (ref 150–400)
RBC: 4.27 MIL/uL (ref 3.87–5.11)
RDW: 14.6 % (ref 11.5–15.5)
WBC: 7.8 10*3/uL (ref 4.0–10.5)
nRBC: 0 % (ref 0.0–0.2)

## 2020-12-24 LAB — TYPE AND SCREEN
ABO/RH(D): O POS
Antibody Screen: NEGATIVE

## 2020-12-24 MED ORDER — ONDANSETRON HCL 4 MG/2ML IJ SOLN
4.0000 mg | Freq: Four times a day (QID) | INTRAMUSCULAR | Status: DC | PRN
  Administered 2020-12-24: 4 mg via INTRAVENOUS
  Filled 2020-12-24: qty 2

## 2020-12-24 MED ORDER — DIPHENHYDRAMINE HCL 25 MG PO CAPS: 25.0000 mg | ORAL_CAPSULE | Freq: Four times a day (QID) | ORAL | Status: DC | PRN

## 2020-12-24 MED ORDER — TETANUS-DIPHTH-ACELL PERTUSSIS 5-2.5-18.5 LF-MCG/0.5 IM SUSY: 0.5000 mL | PREFILLED_SYRINGE | Freq: Once | INTRAMUSCULAR | Status: DC

## 2020-12-24 MED ORDER — SODIUM CHLORIDE 0.9 % IV SOLN
5.0000 10*6.[IU] | Freq: Once | INTRAVENOUS | Status: AC
  Administered 2020-12-24: 5 10*6.[IU] via INTRAVENOUS
  Filled 2020-12-24: qty 5

## 2020-12-24 MED ORDER — PENICILLIN G POT IN DEXTROSE 60000 UNIT/ML IV SOLN
3.0000 10*6.[IU] | INTRAVENOUS | Status: DC
  Administered 2020-12-24: 3 10*6.[IU] via INTRAVENOUS
  Filled 2020-12-24: qty 50

## 2020-12-24 MED ORDER — LACTATED RINGERS IV SOLN: 500.0000 mL | INTRAVENOUS | Status: DC | PRN

## 2020-12-24 MED ORDER — LIDOCAINE HCL (PF) 1 % IJ SOLN: 30.0000 mL | INTRAMUSCULAR | Status: DC | PRN

## 2020-12-24 MED ORDER — ONDANSETRON HCL 4 MG/2ML IJ SOLN: 4.0000 mg | INTRAMUSCULAR | Status: DC | PRN

## 2020-12-24 MED ORDER — LIDOCAINE HCL (PF) 1 % IJ SOLN
INTRAMUSCULAR | Status: DC | PRN
  Administered 2020-12-24 (×2): 5 mL via EPIDURAL

## 2020-12-24 MED ORDER — PHENYLEPHRINE 40 MCG/ML (10ML) SYRINGE FOR IV PUSH (FOR BLOOD PRESSURE SUPPORT): 80.0000 ug | PREFILLED_SYRINGE | INTRAVENOUS | Status: DC | PRN

## 2020-12-24 MED ORDER — PRENATAL MULTIVITAMIN CH
1.0000 | ORAL_TABLET | Freq: Every day | ORAL | Status: DC
  Administered 2020-12-25: 1 via ORAL
  Filled 2020-12-24: qty 1

## 2020-12-24 MED ORDER — MEDROXYPROGESTERONE ACETATE 150 MG/ML IM SUSP: 150.0000 mg | INTRAMUSCULAR | Status: DC | PRN

## 2020-12-24 MED ORDER — ACETAMINOPHEN 325 MG PO TABS: 650.0000 mg | ORAL_TABLET | ORAL | Status: DC | PRN

## 2020-12-24 MED ORDER — COCONUT OIL OIL: 1.0000 "application " | TOPICAL_OIL | Status: DC | PRN

## 2020-12-24 MED ORDER — OXYTOCIN-SODIUM CHLORIDE 30-0.9 UT/500ML-% IV SOLN
1.0000 m[IU]/min | INTRAVENOUS | Status: DC
  Administered 2020-12-24: 2 m[IU]/min via INTRAVENOUS

## 2020-12-24 MED ORDER — LACTATED RINGERS IV SOLN: INTRAVENOUS | Status: DC

## 2020-12-24 MED ORDER — LACTATED RINGERS IV SOLN
500.0000 mL | Freq: Once | INTRAVENOUS | Status: AC
  Administered 2020-12-24: 500 mL via INTRAVENOUS

## 2020-12-24 MED ORDER — EPHEDRINE 5 MG/ML INJ: 10.0000 mg | INTRAVENOUS | Status: DC | PRN

## 2020-12-24 MED ORDER — OXYTOCIN-SODIUM CHLORIDE 30-0.9 UT/500ML-% IV SOLN
2.5000 [IU]/h | INTRAVENOUS | Status: DC
  Administered 2020-12-24: 2.5 [IU]/h via INTRAVENOUS
  Filled 2020-12-24: qty 500

## 2020-12-24 MED ORDER — WITCH HAZEL-GLYCERIN EX PADS: 1.0000 "application " | MEDICATED_PAD | CUTANEOUS | Status: DC | PRN

## 2020-12-24 MED ORDER — OXYCODONE-ACETAMINOPHEN 5-325 MG PO TABS
1.0000 | ORAL_TABLET | ORAL | Status: DC | PRN
Start: 2020-12-24 — End: 2020-12-24

## 2020-12-24 MED ORDER — TERBUTALINE SULFATE 1 MG/ML IJ SOLN: 0.2500 mg | Freq: Once | INTRAMUSCULAR | Status: DC | PRN

## 2020-12-24 MED ORDER — ONDANSETRON HCL 4 MG PO TABS: 4.0000 mg | ORAL_TABLET | ORAL | Status: DC | PRN

## 2020-12-24 MED ORDER — DIBUCAINE (PERIANAL) 1 % EX OINT: 1.0000 "application " | TOPICAL_OINTMENT | CUTANEOUS | Status: DC | PRN

## 2020-12-24 MED ORDER — DIPHENHYDRAMINE HCL 50 MG/ML IJ SOLN: 12.5000 mg | INTRAMUSCULAR | Status: DC | PRN

## 2020-12-24 MED ORDER — SIMETHICONE 80 MG PO CHEW: 80.0000 mg | CHEWABLE_TABLET | ORAL | Status: DC | PRN

## 2020-12-24 MED ORDER — ZOLPIDEM TARTRATE 5 MG PO TABS: 5.0000 mg | ORAL_TABLET | Freq: Every evening | ORAL | Status: DC | PRN

## 2020-12-24 MED ORDER — FERROUS SULFATE 325 (65 FE) MG PO TABS
325.0000 mg | ORAL_TABLET | ORAL | Status: DC
  Administered 2020-12-25: 325 mg via ORAL
  Filled 2020-12-24: qty 1

## 2020-12-24 MED ORDER — SENNOSIDES-DOCUSATE SODIUM 8.6-50 MG PO TABS
2.0000 | ORAL_TABLET | Freq: Every day | ORAL | Status: DC
  Administered 2020-12-25 – 2020-12-26 (×2): 2 via ORAL
  Filled 2020-12-24 (×2): qty 2

## 2020-12-24 MED ORDER — OXYTOCIN BOLUS FROM INFUSION
333.0000 mL | Freq: Once | INTRAVENOUS | Status: AC
  Administered 2020-12-24: 333 mL via INTRAVENOUS

## 2020-12-24 MED ORDER — IBUPROFEN 600 MG PO TABS
600.0000 mg | ORAL_TABLET | Freq: Four times a day (QID) | ORAL | Status: DC
  Administered 2020-12-24 – 2020-12-26 (×6): 600 mg via ORAL
  Filled 2020-12-24 (×6): qty 1

## 2020-12-24 MED ORDER — SOD CITRATE-CITRIC ACID 500-334 MG/5ML PO SOLN: 30.0000 mL | ORAL | Status: DC | PRN

## 2020-12-24 MED ORDER — FENTANYL-BUPIVACAINE-NACL 0.5-0.125-0.9 MG/250ML-% EP SOLN
12.0000 mL/h | EPIDURAL | Status: DC | PRN
  Administered 2020-12-24: 12 mL/h via EPIDURAL
  Filled 2020-12-24: qty 250

## 2020-12-24 MED ORDER — OXYCODONE-ACETAMINOPHEN 5-325 MG PO TABS: 2.0000 | ORAL_TABLET | ORAL | Status: DC | PRN

## 2020-12-24 MED ORDER — BENZOCAINE-MENTHOL 20-0.5 % EX AERO: 1.0000 "application " | INHALATION_SPRAY | CUTANEOUS | Status: DC | PRN

## 2020-12-24 NOTE — Discharge Summary (Signed)
Postpartum Discharge Summary   Patient Name: Melissa Thornton DOB: [birth year] MRN: [id number]  Date of admission: 12/24/2020 Delivery date:12/24/2020  Delivering provider: Aletha Halim  Date of discharge: 12/26/2020  Admitting diagnosis: Fetal growth retardation, antenatal [O36.5990] Intrauterine pregnancy: [redacted]w[redacted]d    Secondary diagnosis:  Active Problems:   Supervision of high risk pregnancy, antepartum   Gestational diabetes   GBS (group B Streptococcus carrier), +RV culture, currently pregnant   Fetal growth retardation, antenatal  Additional problems: Placental abruption    Discharge diagnosis: Term Pregnancy Delivered                                              Post partum procedures:none Augmentation: AROM and Pitocin Complications: Placental Abruption  Hospital course: Induction of Labor With Vaginal Delivery   25y.o. yo G2P1001 at 340w0das admitted to the hospital 12/24/2020 for induction of labor.  Indication for induction: A1 DM.  Patient had an uncomplicated labor course as follows: Membrane Rupture Time/Date: 2:46 PM ,12/24/2020   Delivery Method:Vaginal, Vacuum (Extractor)  Episiotomy: None  Lacerations:    Details of delivery can be found in separate delivery note.  Patient had a routine postpartum course. Patient is discharged home 12/26/20.  Newborn Data: Birth date:12/24/2020  Birth time:2:52 PM  Gender:Female  Living status:Living  Apgars:3 ,7  Weight:2750 g   Magnesium Sulfate received: No BMZ received: No Rhophylac:N/A MMR:N/A T-DaP:Given prenatally Flu: Declined  Transfusion:No  Physical exam  Vitals:   12/25/20 0255 12/25/20 0653 12/25/20 2000 12/26/20 0500  BP: (!) 126/91 96/85 121/74 122/88  Pulse: 87 91 87 76  Resp: 18 18 18 17   Temp: 98.1 F (36.7 C) 97.9 F (36.6 C) 98 F (36.7 C) 98.2 F (36.8 C)  TempSrc: Oral Oral Oral Oral  SpO2: 100% 99%    Weight:      Height:       General: alert, cooperative and no distress Lochia:  appropriate Uterine Fundus: firm Incision: N/A DVT Evaluation: No evidence of DVT seen on physical exam. No cords or calf tenderness. No significant calf/ankle edema. Labs: Lab Results  Component Value Date   WBC 7.8 12/24/2020   HGB 11.1 (L) 12/24/2020   HCT 35.3 (L) 12/24/2020   MCV 82.7 12/24/2020   PLT 387 12/24/2020   CMP Latest Ref Rng & Units 12/25/2020  Glucose 70 - 99 mg/dL 103(H)  BUN 6 - 20 mg/dL 5(L)  Creatinine 0.44 - 1.00 mg/dL 0.72  Sodium 135 - 145 mmol/L 137  Potassium 3.5 - 5.1 mmol/L 3.7  Chloride 98 - 111 mmol/L 104  CO2 22 - 32 mmol/L 25  Calcium 8.9 - 10.3 mg/dL 8.9  Total Protein 6.5 - 8.1 g/dL 5.6(L)  Total Bilirubin 0.3 - 1.2 mg/dL 0.6  Alkaline Phos 38 - 126 U/L 158(H)  AST 15 - 41 U/L 71(H)  ALT 0 - 44 U/L 53(H)   EdFlavia Shippercore: Edinburgh Postnatal Depression Scale Screening Tool 12/24/2020  I have been able to laugh and see the funny side of things. (No Data)     After visit meds:  Allergies as of 12/26/2020   No Known Allergies     Medication List    STOP taking these medications   Accu-Chek Softclix Lancets lancets   glucose blood test strip     TAKE these medications   Blood Pressure Kit  Devi 1 Device by Does not apply route as needed.   coconut oil Oil Apply 1 application topically as needed.   famotidine 20 MG tablet Commonly known as: PEPCID Take 20 mg by mouth daily.   ibuprofen 600 MG tablet Commonly known as: ADVIL Take 1 tablet (600 mg total) by mouth every 6 (six) hours as needed for moderate pain or cramping.   prenatal multivitamin Tabs tablet Take 1 tablet by mouth daily at 12 noon.        Discharge home in stable condition Infant Feeding: Breast Infant Disposition:home with mother Discharge instruction: per After Visit Summary and Postpartum booklet. Activity: Advance as tolerated. Pelvic rest for 6 weeks.  Diet: routine diet Future Appointments: Future Appointments  Date Time Provider [place]  01/22/2021  8:20 AM [facility]  01/22/2021  9:15 AM Clarnce Flock, MD [facility]   Follow up Visit:  Welcome for [facility]. Go in 4 week(s).   Specialty: Obstetrics and Gynecology Why: Follow up in 4 weeks for postpartum appointment  Contact information: [place] [id number] [phone]               Please schedule this patient for a In person postpartum visit in 4 weeks with the following provider: Any provider. Additional Postpartum F/U:2 hour GTT  High risk pregnancy complicated by: GDM Delivery mode:  Vaginal, Vacuum (Extractor)  Anticipated Birth Control:  IUD   12/26/2020 Lajean Manes, CNM

## 2020-12-24 NOTE — Progress Notes (Signed)
Became aware of OBIX glitch that caused all of pts VS not to transfer over. All VS during this time observed to be WNL just not documented. 1528 forward VS entered manually.

## 2020-12-24 NOTE — Anesthesia Preprocedure Evaluation (Signed)
Anesthesia Evaluation  Patient identified by MRN, date of birth, ID band Patient awake    Reviewed: Allergy & Precautions, H&P , NPO status , Patient's Chart, lab work & pertinent test results  History of Anesthesia Complications Negative for: history of anesthetic complications  Airway Mallampati: II  TM Distance: >3 FB Neck ROM: full    Dental no notable dental hx. (+) Teeth Intact   Pulmonary neg pulmonary ROS,    Pulmonary exam normal breath sounds clear to auscultation       Cardiovascular negative cardio ROS Normal cardiovascular exam Rhythm:regular Rate:Normal     Neuro/Psych negative neurological ROS  negative psych ROS   GI/Hepatic negative GI ROS, Neg liver ROS,   Endo/Other  diabetes, GestationalMorbid obesity  Renal/GU negative Renal ROS  negative genitourinary   Musculoskeletal   Abdominal (+) + obese,   Peds  Hematology  (+) Blood dyscrasia, anemia ,   Anesthesia Other Findings   Reproductive/Obstetrics (+) Pregnancy                             Anesthesia Physical Anesthesia Plan  ASA: III  Anesthesia Plan: Epidural   Post-op Pain Management:    Induction:   PONV Risk Score and Plan:   Airway Management Planned:   Additional Equipment:   Intra-op Plan:   Post-operative Plan:   Informed Consent: I have reviewed the patients History and Physical, chart, labs and discussed the procedure including the risks, benefits and alternatives for the proposed anesthesia with the patient or authorized representative who has indicated his/her understanding and acceptance.       Plan Discussed with:   Anesthesia Plan Comments:         Anesthesia Quick Evaluation

## 2020-12-24 NOTE — Anesthesia Procedure Notes (Signed)
Epidural Patient location during procedure: OB Start time: 12/24/2020 12:56 PM End time: 12/24/2020 1:06 PM  Staffing Anesthesiologist: Leonides Grills, MD Performed: anesthesiologist   Preanesthetic Checklist Completed: patient identified, IV checked, site marked, risks and benefits discussed, monitors and equipment checked, pre-op evaluation and timeout performed  Epidural Patient position: sitting Prep: DuraPrep Patient monitoring: heart rate, cardiac monitor, continuous pulse ox and blood pressure Approach: midline Location: L4-L5 Injection technique: LOR air  Needle:  Needle type: Tuohy  Needle gauge: 17 G Needle length: 9 cm Needle insertion depth: 9 cm Catheter type: closed end flexible Catheter size: 19 Gauge Catheter at skin depth: 15 cm Test dose: negative and Other  Assessment Events: blood not aspirated, injection not painful, no injection resistance and negative IV test  Additional Notes Informed consent obtained prior to proceeding including risk of failure, 1% risk of PDPH, risk of minor discomfort and bruising. Discussed alternatives to epidural analgesia and patient desires to proceed.  Timeout performed pre-procedure verifying patient name, procedure, and platelet count.  Patient tolerated procedure well. Reason for block:procedure for pain

## 2020-12-24 NOTE — H&P (Signed)
OBSTETRIC ADMISSION HISTORY AND PHYSICAL  Melissa Thornton is a 25 y.o. female G2P1001 with IUP at 78w0dby 19 wk u/s presenting for IOL-FGR. She reports +FMs, No LOF, no VB, no blurry vision, headaches or peripheral edema, and RUQ pain.  She plans on breast feeding. She request outpatient liletta for birth control. [facility] [place] [facility] [place] --->  Estimated Date of Delivery: 01/07/21  Sono:    12/10/20_0 , CWD, normal anatomy, cephalic presentation, anterior placental lie, 2379g, 11% EFW   Prenatal History/Complications:  FGR (146%KZL A1GDM GBS positive BMI 43  Past Medical History: Past Medical History:  Diagnosis Date  . Gestational diabetes   . Medical history non-contributory     Past Surgical History: Past Surgical History:  Procedure Laterality Date  . NO PAST SURGERIES      Obstetrical History: OB History    Gravida  2   Para  1   Term  1   Preterm  0   AB  0   Living  1     SAB  0   IAB  0   Ectopic  0   Multiple  0   Live Births  1           Social History Social History   Socioeconomic History  . Marital status: Single    Spouse name: Not on file  . Number of children: Not on file  . Years of education: Not on file  . Highest education level: Not on file  Occupational History  . Not on file  Tobacco Use  . Smoking status: Never Smoker  . Smokeless tobacco: Never Used  Vaping Use  . Vaping Use: Never used  Substance and Sexual Activity  . Alcohol use: Not Currently    Comment: occasionally  . Drug use: No  . Sexual activity: Yes    Birth control/protection: None  Other Topics Concern  . Not on file  Social History Narrative  . Not on file   Social Determinants of Health   Financial Resource Strain: Not on file  Food Insecurity: No Food Insecurity  . Worried About RCharity fundraiserin the Last Year: Never true  . Ran Out of Food in the Last Year: Never true  Transportation Needs: No  Transportation Needs  . Lack of Transportation (Medical): No  . Lack of Transportation (Non-Medical): No  Physical Activity: Not on file  Stress: Not on file  Social Connections: Not on file    Family History: Family History  Problem Relation Age of Onset  . Cancer Neg Hx   . Diabetes Neg Hx   . Hypertension Neg Hx   . Miscarriages / Stillbirths Neg Hx   . Birth defects Neg Hx     Allergies: No Known Allergies  Medications Prior to Admission  Medication Sig Dispense Refill Last Dose  . Accu-Chek Softclix Lancets lancets Use as instructed 100 each 12   . Blood Pressure Monitoring (BLOOD PRESSURE KIT) DEVI 1 Device by Does not apply route as needed. 1 each 0   . glucose blood test strip Use as instructed 100 each 12   . Prenatal Vit-Fe Fumarate-FA (PRENATAL VITAMINS PO) Take 1 tablet by mouth daily.        Review of Systems   All systems reviewed and negative except as stated in HPI  Blood pressure 132/79, pulse 94, temperature 98 F (36.7 C), temperature source Oral, height _1  (1.499  m), weight 98.6 kg, last menstrual period 03/27/2020. General appearance: alert, cooperative and no distress Lungs: normal respiratory effort Heart: regular rate and rhythm Abdomen: soft, non-tender; gravid Pelvic: as noted below Extremities: Homans sign is negative, no sign of DVT Presentation: cephalic by exam Fetal monitoringBaseline: 135 bpm, Variability: Good {> 6 bpm), Accelerations: Reactive and Decelerations: Absent Uterine activity Irregular, palpates mild     Prenatal labs: ABO, Rh: --/--/PENDING (02/17 0815) Antibody: PENDING (02/17 0815) Rubella: 5.02 (08/02 1126) RPR: Non Reactive (12/15 0920)  HBsAg: NON REACTIVE (02/13 0526)  HIV: Non Reactive (12/15 0920)  GBS: Positive/-- (02/03 1635)  2 hr Glucola failed Genetic screening declined Anatomy US 11%ile, otherwise normal  Prenatal Transfer Tool  Maternal Diabetes: Yes:  Diabetes Type:  Diet controlled Genetic  Screening: Declined Maternal Ultrasounds/Referrals: Normal Fetal Ultrasounds or other Referrals:  Referred to Materal Fetal Medicine  Maternal Substance Abuse:  No Significant Maternal Medications:  None Significant Maternal Lab Results: Group B Strep positive  Results for orders placed or performed during the hospital encounter of 12/24/20 (from the past 24 hour(s))  Type and screen   Collection Time: 12/24/20  8:15 AM  Result Value Ref Range   ABO/RH(D) PENDING    Antibody Screen PENDING    Sample Expiration      12/27/2020,2359 Performed at [facility], [address] [place], [place] [address]     Patient Active Problem List   Diagnosis Date Noted  . Fetal growth retardation, antenatal 12/24/2020  . GBS (group B Streptococcus carrier), +RV culture, currently pregnant 12/16/2020  . Gestational diabetes 10/22/2020  . Supervision of high risk pregnancy, antepartum 06/03/2020  . Obesity in pregnancy 06/03/2020    Assessment/Plan:  Melissa Thornton is a 25 y.o. G2P1001 at 76w0dhere for IOL-FGR (11%ile).  #IOL:  -Discussed r/b of induction including fetal distress, serial induction, pain, and increased risk of c/s delivery -Discussed induction methods including cervical ripening agents, foley bulbs, and pitocin -Patient verbalizes understanding and wishes to proceed with induction process. Given cervical exam will pitocin. #Pain: PRN, Okay for epidural when desired #FWB: Cat I  #ID: GBS pos, PCN #MOF: breast #MOC: outpatient liletta #Circ: n/a  #A1GDM: BGL on admit 852mdL, q4 BGL checks in latent labor, increase to q2 in active labor.  #BMI 43McLoudCNM  12/24/2020, 9:09 AM

## 2020-12-25 LAB — COMPREHENSIVE METABOLIC PANEL
ALT: 53 U/L — ABNORMAL HIGH (ref 0–44)
AST: 71 U/L — ABNORMAL HIGH (ref 15–41)
Albumin: 2.4 g/dL — ABNORMAL LOW (ref 3.5–5.0)
Alkaline Phosphatase: 158 U/L — ABNORMAL HIGH (ref 38–126)
Anion gap: 8 (ref 5–15)
BUN: 5 mg/dL — ABNORMAL LOW (ref 6–20)
CO2: 25 mmol/L (ref 22–32)
Calcium: 8.9 mg/dL (ref 8.9–10.3)
Chloride: 104 mmol/L (ref 98–111)
Creatinine, Ser: 0.72 mg/dL (ref 0.44–1.00)
GFR, Estimated: >60 mL/min (ref >60)
Glucose, Bld: 103 mg/dL — ABNORMAL HIGH (ref 70–99)
Potassium: 3.7 mmol/L (ref 3.5–5.1)
Sodium: 137 mmol/L (ref 135–145)
Total Bilirubin: 0.6 mg/dL (ref 0.3–1.2)
Total Protein: 5.6 g/dL — ABNORMAL LOW (ref 6.5–8.1)

## 2020-12-25 LAB — GLUCOSE, CAPILLARY: Glucose-Capillary: 106 mg/dL — ABNORMAL HIGH (ref 70–99)

## 2020-12-25 LAB — RPR: RPR Ser Ql: NONREACTIVE

## 2020-12-25 NOTE — Anesthesia Postprocedure Evaluation (Signed)
Anesthesia Post Note  Patient: Melissa Thornton  Procedure(s) Performed: AN AD HOC LABOR EPIDURAL     Patient location during evaluation: Mother Baby Anesthesia Type: Epidural Level of consciousness: awake Pain management: satisfactory to patient Vital Signs Assessment: post-procedure vital signs reviewed and stable Respiratory status: spontaneous breathing Cardiovascular status: stable Anesthetic complications: no   No complications documented.  Last Vitals:  Vitals:   12/25/20 0255 12/25/20 0653  BP: (!) 126/91 96/85  Pulse: 87 91  Resp: 18 18  Temp: 36.7 C 36.6 C  SpO2: 100% 99%    Last Pain:  Vitals:   12/25/20 0653  TempSrc: Oral  PainSc: 0-No pain   Pain Goal:                   [employer]

## 2020-12-25 NOTE — Lactation Note (Signed)
This note was copied from a baby's chart. Lactation Consultation Note  Patient Name: Melissa Thornton QQPYP'P Date: 12/25/2020 Reason for consult: Initial assessment;Early term 37-38.6wks Age:26 hours   P2 mother whose infant is now 42 hours old.  This is an ETI at 38+0 weeks weighing 6+1 lbs.  Baby had recently had a bath and mother was doing STS.  Offered to assist with latching and mother agreeable.  Baby was able to latch, however, was too sleepy to initiate sucking.  Mother expressed a few colostrum drops which I finger fed back to baby.  Placed her STS and she fell asleep.  Discussed beginning the DEBP with mother to help stimulate breasts and provide colostrum for supplementation.  Mother willing to pump and will call her RN/LC after STS time following the bath is over (approximately 30 min).  RN updated.  Mom made aware of O/P services, breastfeeding support groups, community resources, and our phone # for post-discharge questions. Mother has a DEBP for home use.  Father present.  Family face timing with other daughter while I was in the room.     Maternal Data Has patient been taught Hand Expression?: Yes Does the patient have breastfeeding experience prior to this delivery?: Yes How long did the patient breastfeed?: 2 years  Feeding Mother's Current Feeding Choice: Breast Milk  LATCH Score Latch: Too sleepy or reluctant, no latch achieved, no sucking elicited.  Audible Swallowing: None  Type of Nipple: Everted at rest and after stimulation  Comfort (Breast/Nipple): Soft / non-tender  Hold (Positioning): Assistance needed to correctly position infant at breast and maintain latch.  LATCH Score: 5   Lactation Tools Discussed/Used    Interventions Interventions: Breast feeding basics reviewed;Assisted with latch;Skin to skin;Breast compression;Adjust position;Position options;Support pillows;Education  Discharge    Consult Status Consult Status:  Follow-up Date: 12/26/20 Follow-up type: In-patient    Dora Sims 12/25/2020, 12:19 PM

## 2020-12-25 NOTE — Progress Notes (Signed)
Post Partum Day 1 Subjective: no complaints, up ad lib, voiding and tolerating PO, small lochia, plans to breastfeed, IUD  Objective: Blood pressure 96/85, pulse 91, temperature 97.9 F (36.6 C), temperature source Oral, resp. rate 18, height 4\' 11"  (1.499 m), weight 98.6 kg, last menstrual period 03/27/2020, SpO2 99 %, unknown if currently breastfeeding.  Physical Exam:  General: alert, cooperative and no distress Lochia:normal flow Chest: CTAB Heart: RRR no m/r/g Abdomen: +BS, soft, nontender,  Uterine Fundus: firm DVT Evaluation: No evidence of DVT seen on physical exam. Extremities: no edema  Recent Labs    12/24/20 0816  HGB 11.1*  HCT 35.3*    Assessment/Plan: Lactation consult CMP pending (LFTs 93/62 yesterday)   LOS: 1 day   12/26/20 12/25/2020, 7:37 AM

## 2020-12-26 MED ORDER — IBUPROFEN 600 MG PO TABS
600.0000 mg | ORAL_TABLET | Freq: Four times a day (QID) | ORAL | 0 refills | Status: AC | PRN
Start: 2020-12-26 — End: ?

## 2020-12-26 MED ORDER — COCONUT OIL OIL: 1.0000 "application " | TOPICAL_OIL | 0 refills | Status: DC | PRN

## 2020-12-26 NOTE — Lactation Note (Signed)
This note was copied from a baby's chart. Lactation Consultation Note  Patient Name: Melissa Thornton JOACZ'Y Date: 12/26/2020 Reason for consult: Follow-up assessment Age:26 Hours P2 Mother reports that she is pumping and breastfeeding. Infant is at 8% wt loss. Mother reports that she just pumped 25 ml . Father is getting ready to bottle feed this to infant with a wide base bottle nipple .  Mother has two pumps at home a Medela and a Mom Cozy. Mother is committed to pumping and giving exp br milk. Discussed treatment and prevention of engorgement.  Plan of Care : Breastfeed infant with feeding cues Supplement infant with ebm/formula, according to supplemental guidelines. Pump using a DEBP after each feeding for 15-20 mins.   Mother to continue to cue base feed infant and feed at least 8-12 times or more in 24 hours and advised to allow for cluster feeding infant as needed.  Mother to continue to due STS. Mother is aware of available LC services at [facility], BFSG'S, OP Dept, and phone # for questions or concerns about breastfeeding.  Mother receptive to all teaching and plan of care.    Maternal Data    Feeding Mother's Current Feeding Choice: Breast Milk Nipple Type: Slow - flow  LATCH Score                    Lactation Tools Discussed/Used    Interventions    Discharge Pump: Personal (Medela and Mom Cozy)  Consult Status      Melissa Thornton 12/26/2020, 9:31 AM

## 2020-12-28 LAB — SURGICAL PATHOLOGY

## 2021-01-19 ENCOUNTER — Other Ambulatory Visit: Payer: Self-pay | Admitting: Lactation Services

## 2021-01-19 DIAGNOSIS — O24410 Gestational diabetes mellitus in pregnancy, diet controlled: Secondary | ICD-10-CM

## 2021-01-22 ENCOUNTER — Ambulatory Visit (INDEPENDENT_AMBULATORY_CARE_PROVIDER_SITE_OTHER): Payer: Medicaid Other | Admitting: Family Medicine

## 2021-01-22 ENCOUNTER — Encounter: Payer: Self-pay | Admitting: Family Medicine

## 2021-01-22 ENCOUNTER — Other Ambulatory Visit: Payer: Self-pay

## 2021-01-22 ENCOUNTER — Other Ambulatory Visit: Payer: Medicaid Other

## 2021-01-22 VITALS — BP 118/83 | HR 77 | Wt 195.7 lb

## 2021-01-22 DIAGNOSIS — O24410 Gestational diabetes mellitus in pregnancy, diet controlled: Secondary | ICD-10-CM | POA: Diagnosis not present

## 2021-01-22 DIAGNOSIS — Z3202 Encounter for pregnancy test, result negative: Secondary | ICD-10-CM | POA: Diagnosis not present

## 2021-01-22 DIAGNOSIS — Z30019 Encounter for initial prescription of contraceptives, unspecified: Secondary | ICD-10-CM

## 2021-01-22 LAB — POCT PREGNANCY, URINE: Preg Test, Ur: POSITIVE — AB

## 2021-01-22 MED ORDER — MEDROXYPROGESTERONE ACETATE 150 MG/ML IM SUSP
150.0000 mg | Freq: Once | INTRAMUSCULAR | Status: AC
Start: 2021-01-22 — End: 2021-01-22
  Administered 2021-01-22: 150 mg via INTRAMUSCULAR

## 2021-01-22 NOTE — Progress Notes (Signed)
Post Partum Visit Note  Melissa Thornton is a 25 y.o. G58P2002 female who presents for a postpartum visit. She is 4 weeks 1 day postpartum following a normal spontaneous vaginal delivery.  I have fully reviewed the prenatal and intrapartum course. The delivery was at 38 gestational weeks.  Anesthesia: epidural. Postpartum course has been uneventful. Baby is doing well. Baby is feeding by breast. Bleeding no bleeding. Bowel function is normal. Bladder function is normal. Patient is not sexually active. Contraception method is none. Postpartum depression screening: negative.    The pregnancy intention screening data noted above was reviewed. Potential methods of contraception were discussed. The patient elected to proceed with Hormonal Injection.    Edinburgh Postnatal Depression Scale - 01/22/21 0934      Edinburgh Postnatal Depression Scale:  In the Past 7 Days   I have been able to laugh and see the funny side of things. 0    I have looked forward with enjoyment to things. 0    I have blamed myself unnecessarily when things went wrong. 0    I have been anxious or worried for no good reason. 0    I have felt scared or panicky for no good reason. 0    Things have been getting on top of me. 1    I have been so unhappy that I have had difficulty sleeping. 0    I have felt sad or miserable. 1    I have been so unhappy that I have been crying. 0    The thought of harming myself has occurred to me. 0    Edinburgh Postnatal Depression Scale Total 2            The following portions of the patient's history were reviewed and updated as appropriate: allergies, current medications, past family history, past medical history, past social history, past surgical history and problem list.  Review of Systems Pertinent items noted in HPI and remainder of comprehensive ROS otherwise negative.    Objective:  BP 118/83   Pulse 77   Wt 195 lb 11.2 oz (88.8 kg)   Breastfeeding Yes   BMI 39.53 kg/m     General:  alert, cooperative and appears stated age  Lungs: comfortable on room air   Vulva:  not evaluated        Assessment:    Normal postpartum exam. Pap smear not done at today's visit.   Plan:   Essential components of care per ACOG recommendations:  1.  Mood and well being: Patient with negative depression screening today. Reviewed local resources for support.  - Patient does not use tobacco.  - hx of drug use? No     2. Infant care and feeding:  -Patient currently breastmilk feeding? Yes If breastmilk feeding discussed return to work and pumping. If needed, patient was provided letter for work to allow for every 2-3 hr pumping breaks, and to be granted a private location to express breastmilk and refrigerated area to store breastmilk. Reviewed importance of draining breast regularly to support lactation. -Social determinants of health (SDOH) reviewed in EPIC. No concerns  3. Sexuality, contraception and birth spacing - Patient does not want a pregnancy in the next year.  Desired family size is 2 children.  - Reviewed forms of contraception in tiered fashion. Patient desired Depo-Provera today.   - Discussed birth spacing of 18 months  4. Sleep and fatigue -Encouraged family/partner/community support of 4 hrs of uninterrupted sleep to help with  mood and fatigue  5. Physical Recovery  - Discussed patients delivery and complications - Patient had no lacerations - Patient has urinary incontinence? No - Patient is safe to resume physical and sexual activity  6.  Health Maintenance - Last pap smear done 12/19/18 and was normal with no testing for HPV. - Mammogram: n/a  7. Chronic Disease - GDM: 2hr gtt today - refused covid vaccine  Venora Maples, MD [facility], [facility]

## 2021-01-22 NOTE — Addendum Note (Signed)
Addended by: Maxwell Marion E on: 01/22/2021 12:08 PM   Modules accepted: Orders

## 2021-01-23 LAB — GLUCOSE TOLERANCE, 2 HOURS
Glucose, 2 hour: 98 mg/dL (ref 65–139)
Glucose, GTT - Fasting: 89 mg/dL (ref 65–99)

## 2021-02-02 ENCOUNTER — Encounter: Payer: Self-pay | Admitting: General Practice

## 2021-03-19 DIAGNOSIS — H6692 Otitis media, unspecified, left ear: Secondary | ICD-10-CM | POA: Diagnosis not present

## 2021-03-19 DIAGNOSIS — H9202 Otalgia, left ear: Secondary | ICD-10-CM | POA: Diagnosis not present

## 2021-10-30 IMAGING — US US MFM FETAL BPP W/O NON-STRESS
1 series · 13 of 26 positions shown · non-contrast
Comparison: none

[Series 1: us mfm fetal bpp w/o non-stress · 26 acquisitions, 13 frames shown]
[im 2/26]
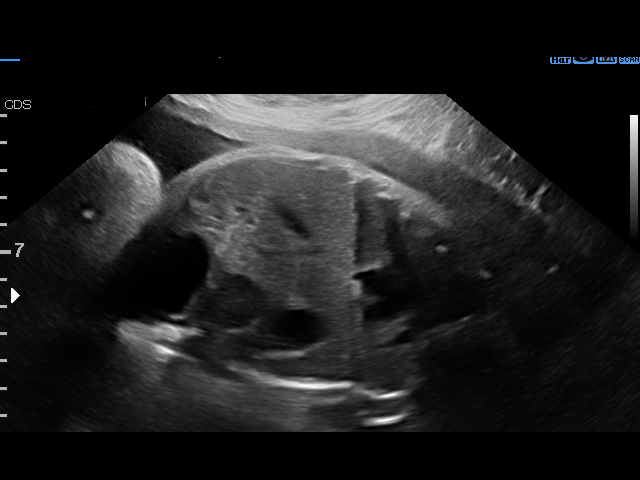
[im 4/26]
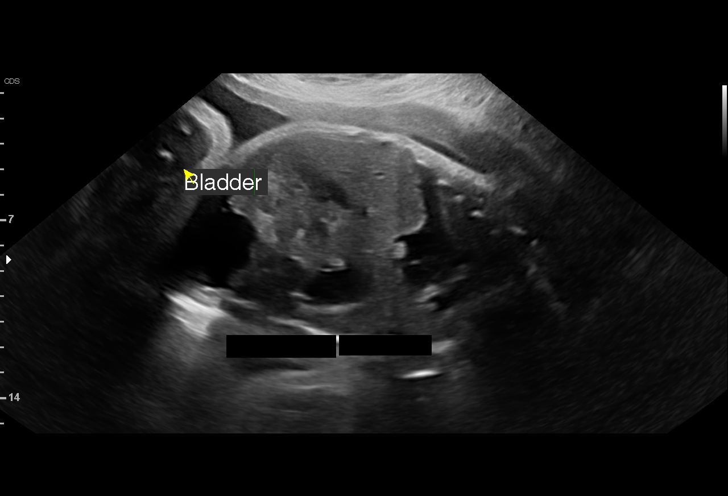
[im 6/26]
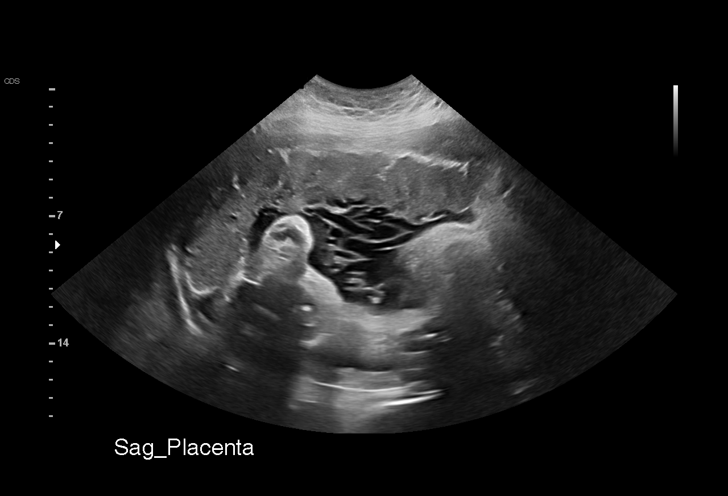
[im 8/26]
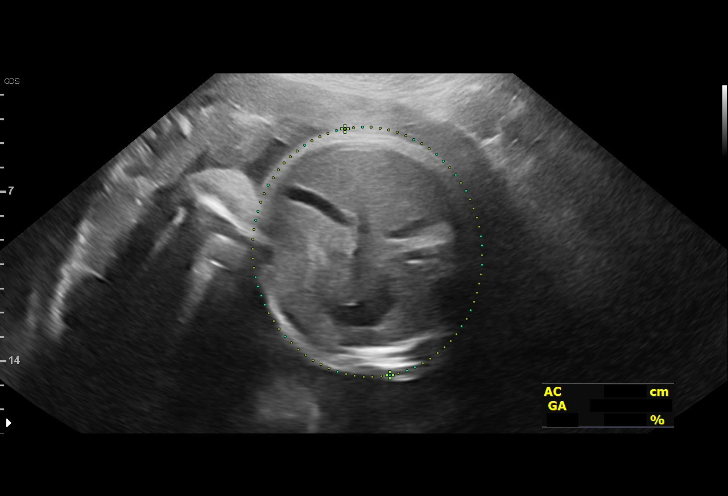
[im 10/26]
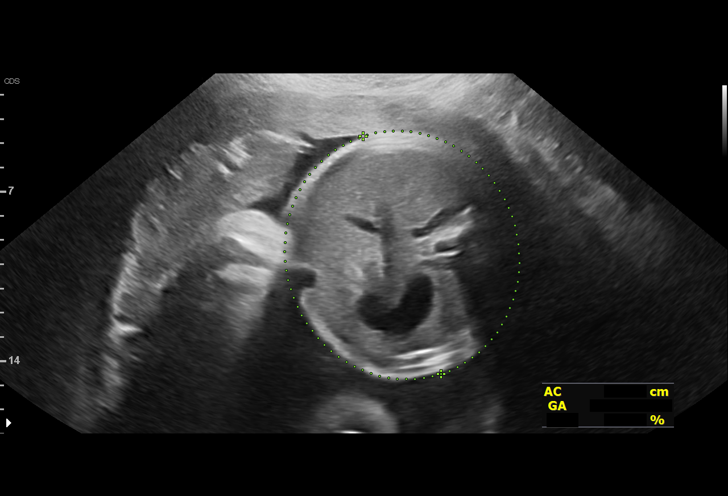
[im 12/26]
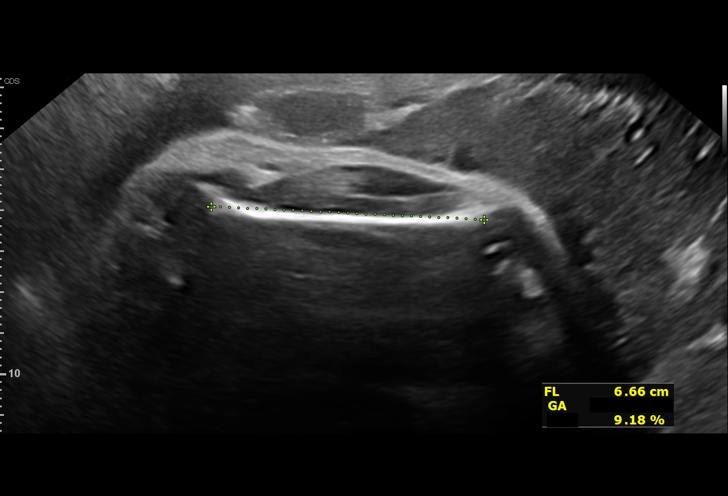
[im 14/26]
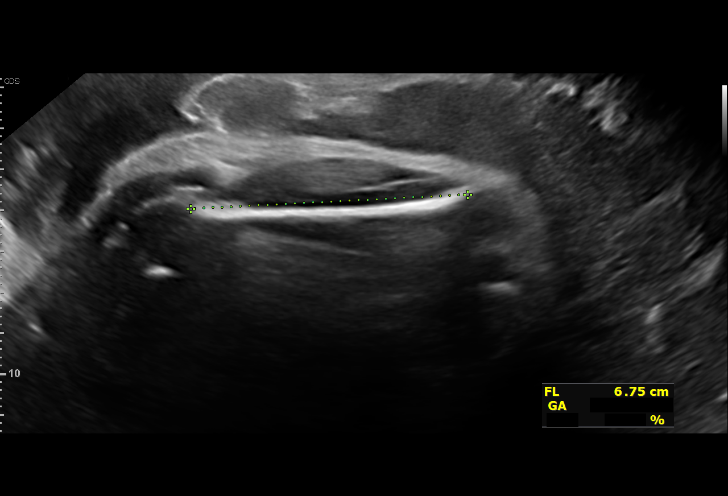
[im 16/26]
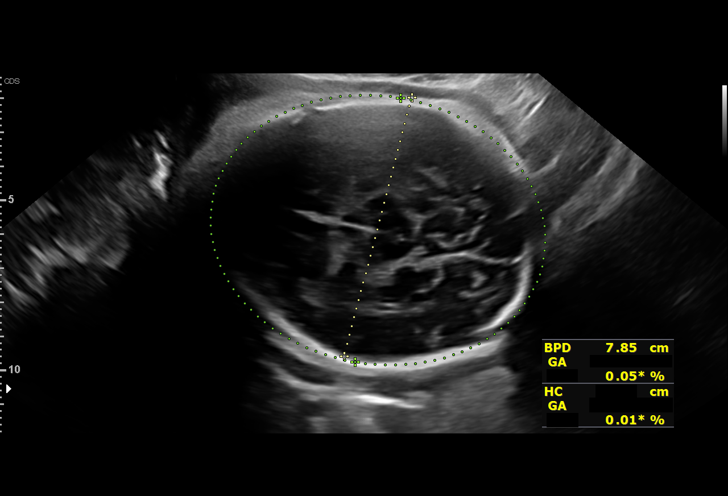
[im 18/26]
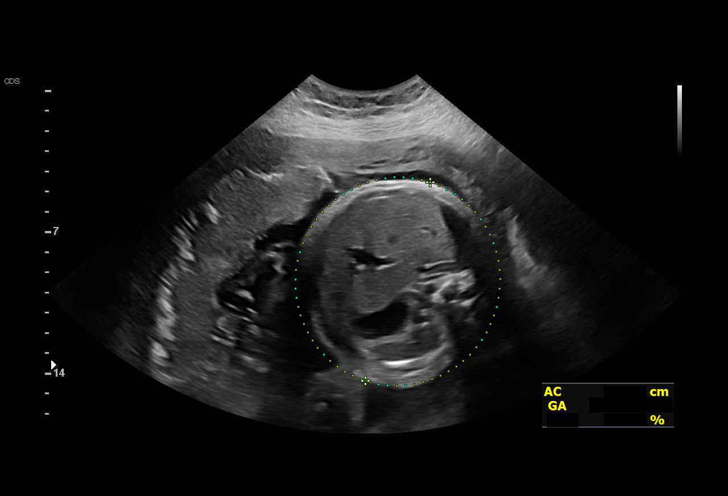
[im 20/26]
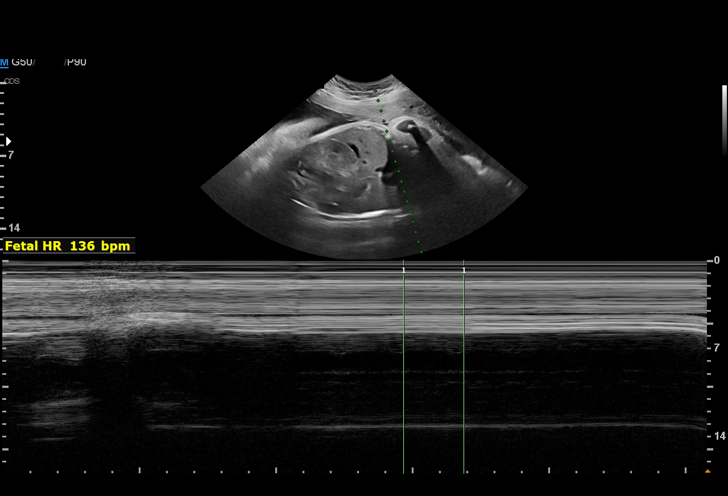
[im 22/26]
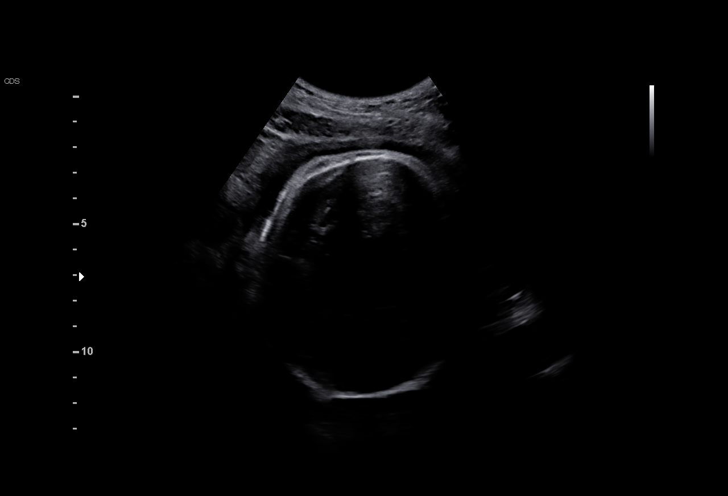
[im 24/26]
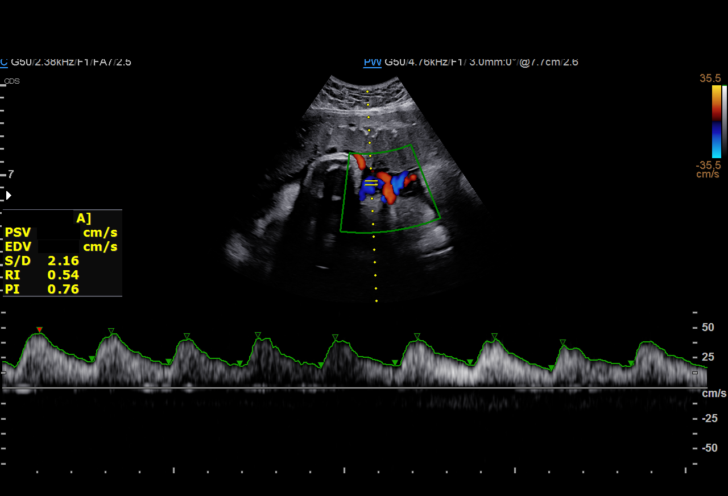
[im 26/26]
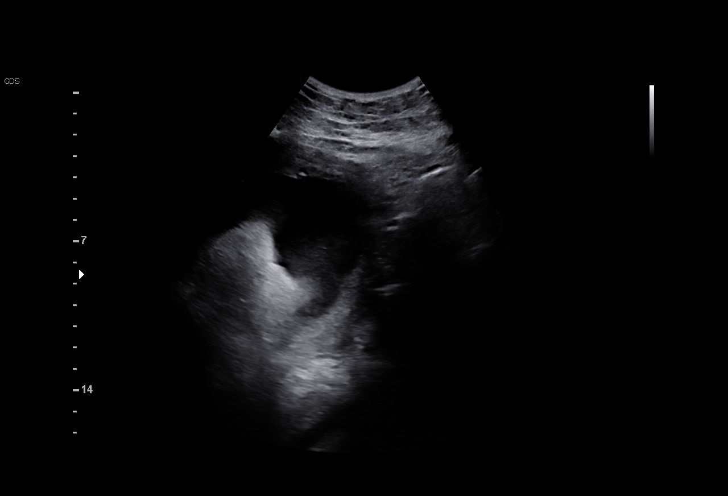

[13 of 26 positions shown; findings below may reference images not displayed]

2  US MFM UA CORD DOPPLER                76820.02    JIM
                                                      POZISA

Indications

 Maternal care for known or suspected poor
 fetal growth, third trimester, not applicable or
 unspecified IUGR
 36 weeks gestation of pregnancy
 Obesity complicating pregnancy, second
 trimester (pregravid BMI 42)
 Gestational diabetes in pregnancy, diet
 controlled
Fetal Evaluation

 Num Of Fetuses:         1
 Fetal Heart Rate(bpm):  136
 Cardiac Activity:       Observed
 Presentation:           Cephalic
 Placenta:               Anterior
 P. Cord Insertion:      Previously Visualized

 Amniotic Fluid
 AFI FV:      Within normal limits

 AFI Sum(cm)     %Tile       Largest Pocket(cm)
 11.68           34
 RUQ(cm)       RLQ(cm)       LUQ(cm)        LLQ(cm)

Biophysical Evaluation

 Amniotic F.V:   Pocket => 2 cm             F. Tone:        Observed
 F. Movement:    Observed                   Score:          [DATE]
 F. Breathing:   Observed
Biometry

 BPD:      78.4  mm     G. Age:  31w 3d        < 1  %    CI:        77.41   %    70 - 86
                                                         FL/HC:      23.8   %    20.1 -
 HC:      282.1  mm     G. Age:  30w 6d        < 1  %    HC/AC:      0.90        0.93 -
 AC:       314   mm     G. Age:  35w 2d         40  %    FL/BPD:     85.7   %    71 - 87
 FL:       67.2  mm     G. Age:  34w 4d         13  %    FL/AC:      21.4   %    20 - 24

 Est. FW:    6024  gm      5 lb 4 oz     11  %
OB History

 Gravidity:    2         Term:   1        Prem:   0        SAB:   0
 TOP:          0       Ectopic:  0        Living: 1
Gestational Age

 LMP:           36w 6d        Date:  03/27/20                 EDD:   01/01/21
 U/S Today:     33w 0d                                        EDD:   01/28/21
 Best:          36w 0d     Det. By:  U/S  (08/07/20)          EDD:   01/07/21
Anatomy

 Cranium:               Previously seen        LVOT:                   Previously seen
 Cavum:                 Previously seen        Aortic Arch:            Previously seen
 Ventricles:            Previously seen        Ductal Arch:            Previously seen
 Choroid Plexus:        Previously seen        Diaphragm:              Appears normal
 Cerebellum:            Previously seen        Stomach:                Appears normal, left
                                                                       sided
 Posterior Fossa:       Previously seen        Abdomen:                Appears normal
 Nuchal Fold:           Not applicable (>20    Abdominal Wall:         Previously seen
                        wks GA)
 Face:                  Profile previously     Cord Vessels:           Previously seen
                        seen
 Lips:                  Previously seen        Kidneys:                Previously seen
 Palate:                Previously seen        Bladder:                Appears normal
 Thoracic:              Previously seen        Spine:                  Previously seen
 Heart:                 Previously seen        Upper Extremities:      Previously seen
 RVOT:                  Previously seen        Lower Extremities:      Previously seen

 Other:  Female gender previously seen. Technically difficult due to maternal
         habitus.
Doppler - Fetal Vessels

 Umbilical Artery
  S/D     %tile      RI    %tile      PI    %tile            ADFV    RDFV
  2.16       33    0.54       35    0.76       36               No      No
Comments

 This patient was seen for a follow up exam due to fetal
 growth restriction noted during her prior ultrasound exams.
 She denies any problems since her last exam and reports
 feeling vigorous fetal movements throughout the day.
 On today's exam, the EFW measures at the 11th percentile
 for her gestational age.  There was normal amniotic fluid
 noted.
 A biophysical profile performed today due to fetal growth
 restriction was [DATE].
 Doppler studies of the umbilical arteries showed a normal
 S/D ratio of 2.16.  There were no signs of absent or reversed
 end-diastolic flow.
 Due to borderline fetal growth restriction, delivery is
 recommended at between 38 to 39 weeks.

 We will continue weekly fetal testing and umbilical artery
 Doppler studies until delivery.

## 2021-11-06 IMAGING — US US MFM FETAL BPP W/O NON-STRESS
1 series · 12 of 28 positions shown · non-contrast
Comparison: none

[Series 1: us mfm fetal bpp w/o non-stress · 28 acquisitions, 12 frames shown]
[im 2/28]
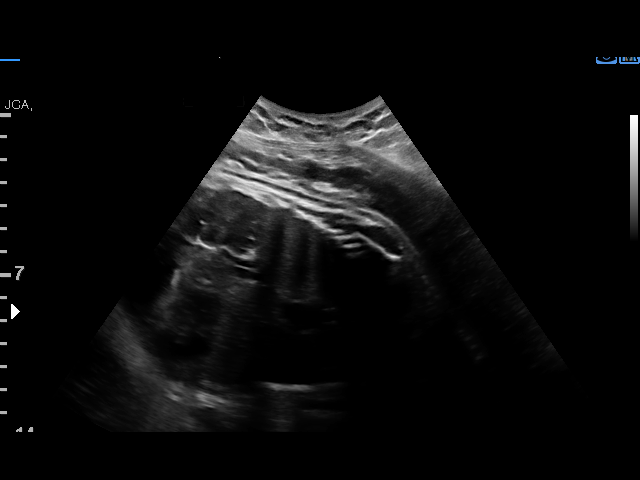
[im 4/28]
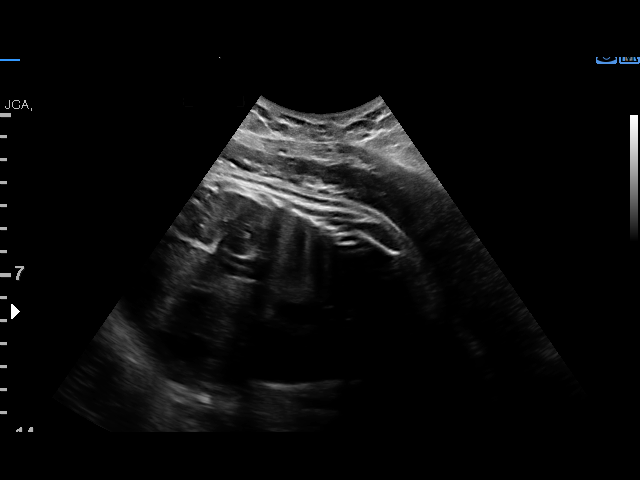
[im 6/28]
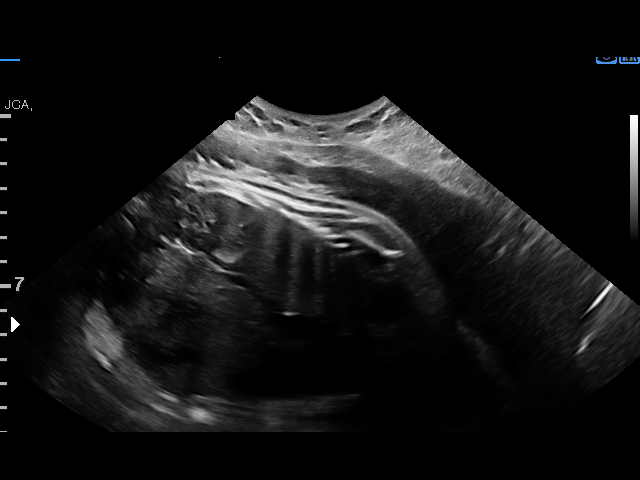
[im 9/28]
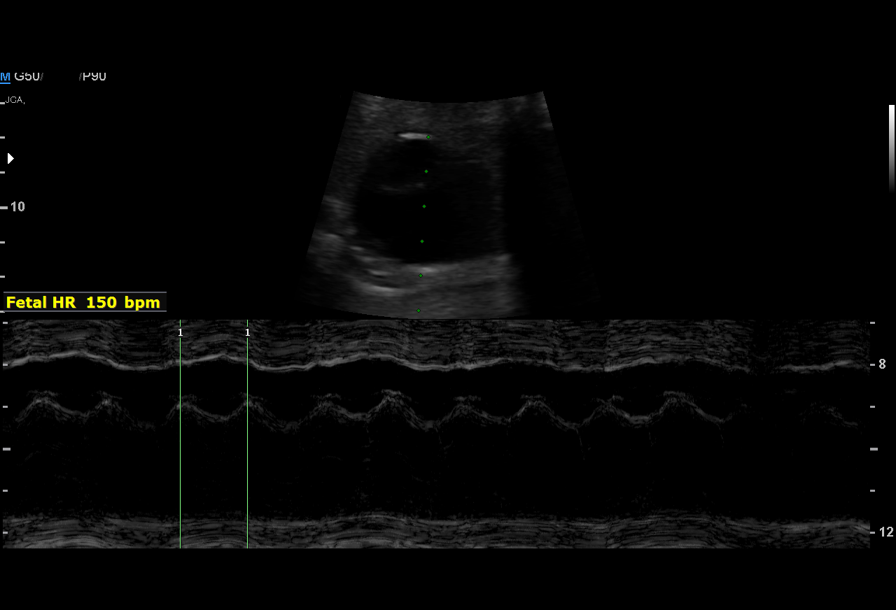
[im 11/28]
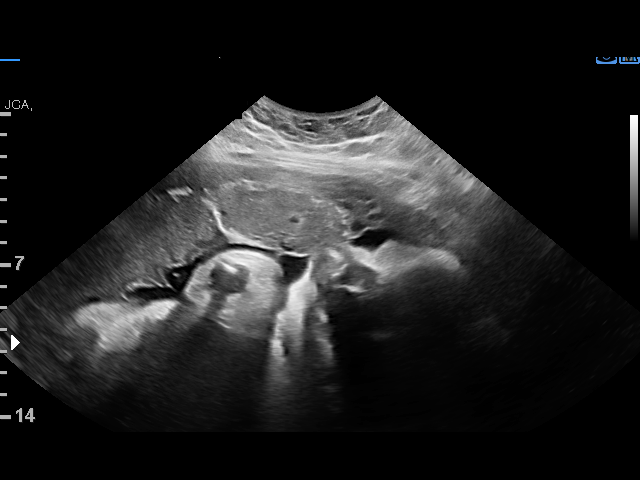
[im 13/28]
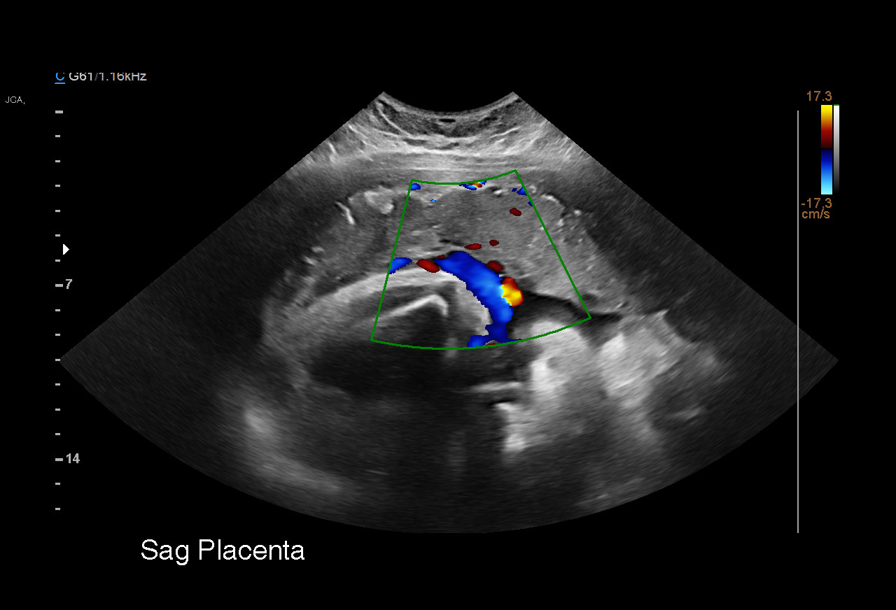
[im 16/28]
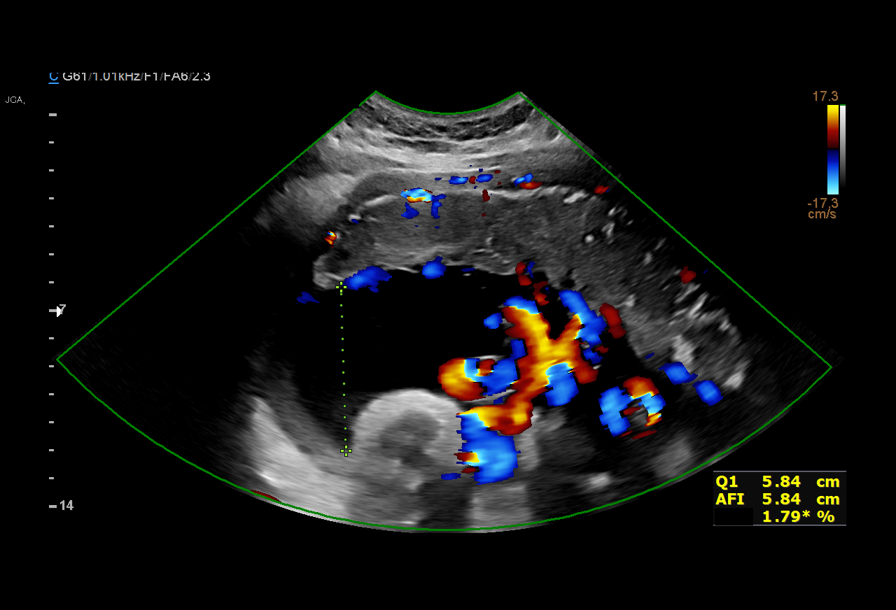
[im 18/28]
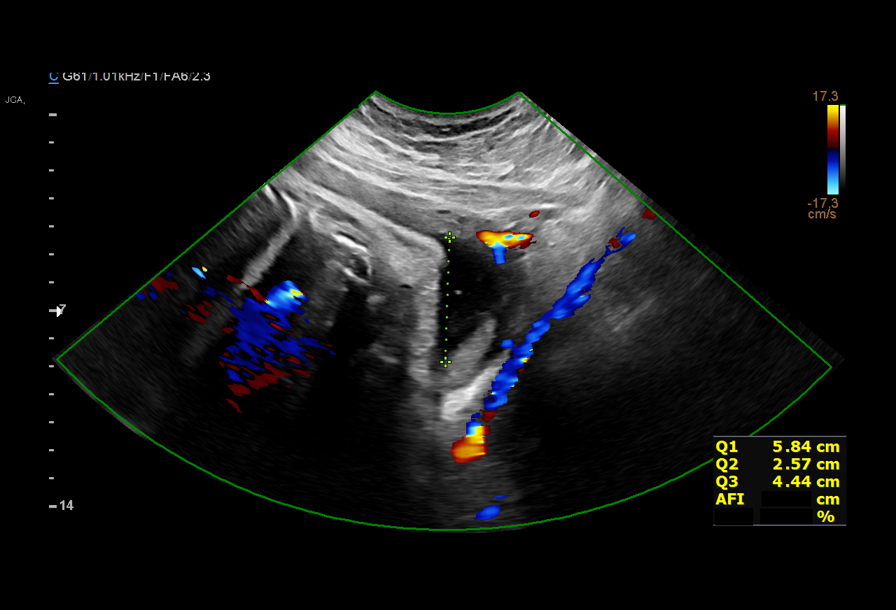
[im 20/28]
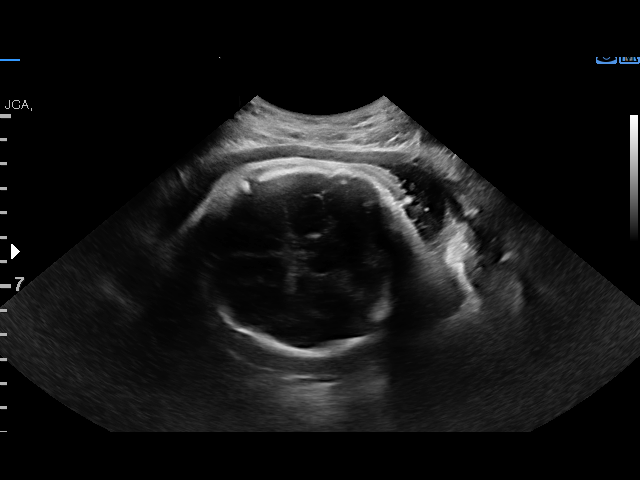
[im 23/28]
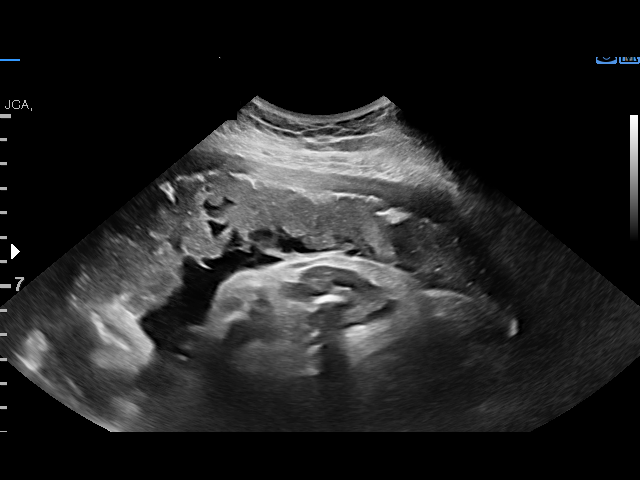
[im 25/28]
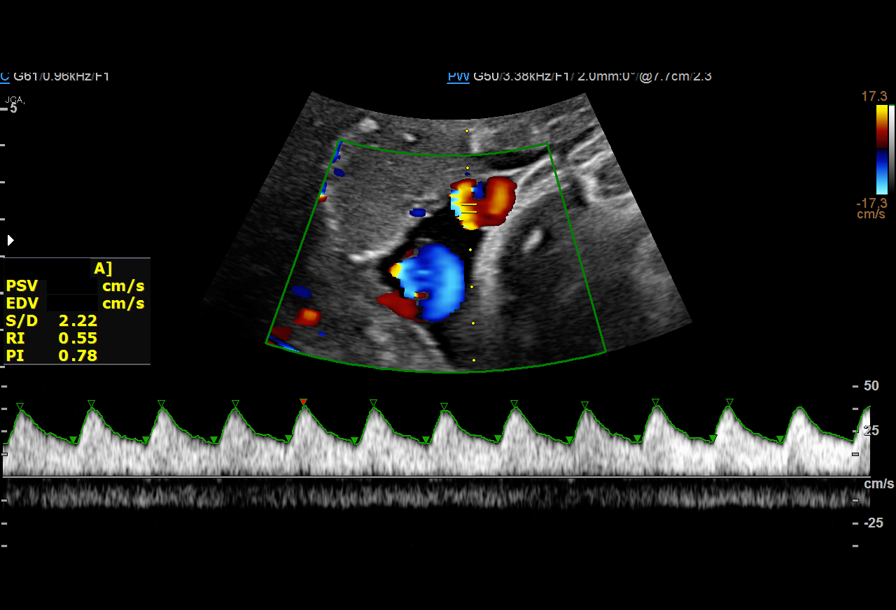
[im 27/28]
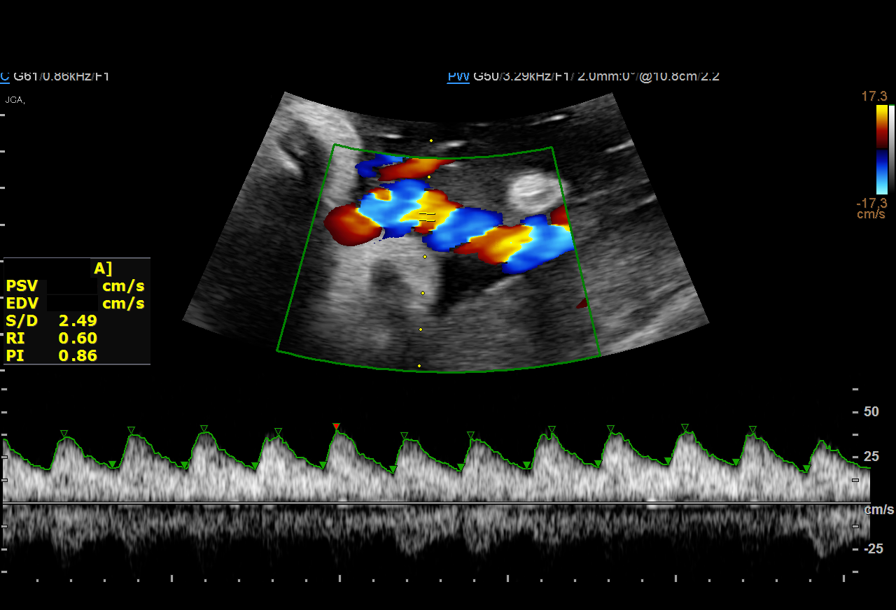

[12 of 28 positions shown; findings below may reference images not displayed]

2  US MFM UA CORD DOPPLER                76820.02    JAYPE
                                                      BERGERON

Indications

 Maternal care for known or suspected poor
 fetal growth, third trimester, not applicable or
 unspecified IUGR
 Gestational diabetes in pregnancy, diet
 controlled
 Obesity complicating pregnancy, second
 trimester (pregravid BMI 42)
 37 weeks gestation of pregnancy
Fetal Evaluation

 Num Of Fetuses:         1
 Fetal Heart Rate(bpm):  147
 Cardiac Activity:       Observed
 Presentation:           Cephalic
 Placenta:               Anterior
 P. Cord Insertion:      Visualized

 Amniotic Fluid
 AFI FV:      Within normal limits

 AFI Sum(cm)     %Tile       Largest Pocket(cm)
 12.85           45

 RUQ(cm)       RLQ(cm)       LUQ(cm)        LLQ(cm)
 5.84          0
Biophysical Evaluation

 Amniotic F.V:   Pocket => 2 cm             F. Tone:        Observed
 F. Movement:    Observed                   Score:          [DATE]
 F. Breathing:   Observed
OB History

 Gravidity:    2         Term:   1        Prem:   0        SAB:   0
 TOP:          0       Ectopic:  0        Living: 1
Gestational Age

 LMP:           37w 6d        Date:  03/27/20                 EDD:   01/01/21
 Best:          37w 0d     Det. By:  U/S  (08/07/20)          EDD:   01/07/21
Doppler - Fetal Vessels

 Umbilical Artery
  S/D     %tile      RI    %tile                             ADFV    RDFV
   2.1       32    0.52       30                                No      No

Impression

 Fetal growth restriction.  On fetal growth assessment last
 week, the estimated fetal weight was at the 11th percentile.
 Previous growth assessment (11/19/20) showed the
 estimated fetal weight at the 5th percentile.

 On today's ultrasound, amniotic fluid is normal and good fetal
 activity seen.  Cephalic presentation.  Umbilical artery
 Doppler showed normal forward diastolic flow.Antenatal
 testing is reassuring. BPP [DATE].

 I reassured the patient of the findings.  We discussed timing
 of delivery and I recommended delivery at 38 or 39 weeks
 gestation.  Patient wants to be delivered at 38 weeks
 gestation.  Blood pressure today at her office is 129/84
 mmHg.

 Patient has an appointment to see her Ob provider next
 week. I will send a message through [REDACTED] to her provider
 regarding timing of delivery.
Recommendations

 We have cancelled her 12/24/20 appointment with us.
                 Jim, Jarvis

## 2022-10-16 DIAGNOSIS — H1032 Unspecified acute conjunctivitis, left eye: Secondary | ICD-10-CM | POA: Diagnosis not present

## 2023-02-27 DIAGNOSIS — S39012A Strain of muscle, fascia and tendon of lower back, initial encounter: Secondary | ICD-10-CM | POA: Diagnosis not present
